# Patient Record
Sex: Female | Born: 1960 | Race: Black or African American | Hispanic: No | Marital: Married | Smoking: Never smoker
Health system: Southern US, Community
[De-identification: ages and names within clinical notes are randomized; demographics above are authoritative.]

## PROBLEM LIST (undated history)

## (undated) DIAGNOSIS — E010 Iodine-deficiency related diffuse (endemic) goiter: Secondary | ICD-10-CM

## (undated) DIAGNOSIS — Z8679 Personal history of other diseases of the circulatory system: Secondary | ICD-10-CM

## (undated) DIAGNOSIS — M545 Low back pain: Secondary | ICD-10-CM

## (undated) DIAGNOSIS — Z87448 Personal history of other diseases of urinary system: Secondary | ICD-10-CM

## (undated) DIAGNOSIS — I499 Cardiac arrhythmia, unspecified: Secondary | ICD-10-CM

## (undated) DIAGNOSIS — Z889 Allergy status to unspecified drugs, medicaments and biological substances status: Secondary | ICD-10-CM

## (undated) DIAGNOSIS — D219 Benign neoplasm of connective and other soft tissue, unspecified: Secondary | ICD-10-CM

## (undated) DIAGNOSIS — N921 Excessive and frequent menstruation with irregular cycle: Secondary | ICD-10-CM

## (undated) DIAGNOSIS — Z86718 Personal history of other venous thrombosis and embolism: Secondary | ICD-10-CM

## (undated) DIAGNOSIS — J302 Other seasonal allergic rhinitis: Secondary | ICD-10-CM

## (undated) DIAGNOSIS — Z8739 Personal history of other diseases of the musculoskeletal system and connective tissue: Secondary | ICD-10-CM

## (undated) DIAGNOSIS — Z8619 Personal history of other infectious and parasitic diseases: Secondary | ICD-10-CM

## (undated) DIAGNOSIS — K219 Gastro-esophageal reflux disease without esophagitis: Secondary | ICD-10-CM

## (undated) HISTORY — PX: THYROID SURGERY: SHX805

## (undated) HISTORY — DX: Personal history of other diseases of urinary system: Z87.448

## (undated) HISTORY — DX: Personal history of other infectious and parasitic diseases: Z86.19

## (undated) HISTORY — PX: OTHER SURGICAL HISTORY: SHX169

## (undated) HISTORY — DX: Excessive and frequent menstruation with irregular cycle: N92.1

## (undated) HISTORY — DX: Personal history of other venous thrombosis and embolism: Z86.718

## (undated) HISTORY — DX: Allergy status to unspecified drugs, medicaments and biological substances: Z88.9

## (undated) HISTORY — PX: THROAT SURGERY: SHX803

## (undated) HISTORY — DX: Personal history of other diseases of the circulatory system: Z86.79

## (undated) HISTORY — PX: ABDOMINAL HYSTERECTOMY: SHX81

## (undated) HISTORY — DX: Low back pain: M54.5

## (undated) HISTORY — DX: Benign neoplasm of connective and other soft tissue, unspecified: D21.9

## (undated) HISTORY — DX: Iodine-deficiency related diffuse (endemic) goiter: E01.0

## (undated) HISTORY — PX: WISDOM TOOTH EXTRACTION: SHX21

## (undated) HISTORY — DX: Personal history of other diseases of the musculoskeletal system and connective tissue: Z87.39

---

## 1980-05-01 HISTORY — PX: BREAST SURGERY: SHX581

## 1997-05-11 ENCOUNTER — Ambulatory Visit (HOSPITAL_COMMUNITY): Admission: RE | Admit: 1997-05-11 | Discharge: 1997-05-11 | Payer: Self-pay | Admitting: *Deleted

## 1998-03-03 HISTORY — PX: TUBAL LIGATION: SHX77

## 1998-08-16 ENCOUNTER — Other Ambulatory Visit: Admission: RE | Admit: 1998-08-16 | Discharge: 1998-08-16 | Payer: Self-pay | Admitting: *Deleted

## 1998-10-26 ENCOUNTER — Ambulatory Visit (HOSPITAL_COMMUNITY): Admission: RE | Admit: 1998-10-26 | Discharge: 1998-10-26 | Payer: Self-pay | Admitting: *Deleted

## 1999-03-04 HISTORY — PX: OTHER SURGICAL HISTORY: SHX169

## 1999-10-30 ENCOUNTER — Other Ambulatory Visit: Admission: RE | Admit: 1999-10-30 | Discharge: 1999-10-30 | Payer: Self-pay | Admitting: Radiology

## 2001-03-03 HISTORY — PX: CHOLECYSTECTOMY: SHX55

## 2001-04-06 ENCOUNTER — Encounter: Payer: Self-pay | Admitting: *Deleted

## 2001-04-06 ENCOUNTER — Encounter: Admission: RE | Admit: 2001-04-06 | Discharge: 2001-04-06 | Payer: Self-pay | Admitting: *Deleted

## 2001-04-12 ENCOUNTER — Emergency Department (HOSPITAL_COMMUNITY): Admission: EM | Admit: 2001-04-12 | Discharge: 2001-04-12 | Payer: Self-pay | Admitting: Emergency Medicine

## 2001-04-12 ENCOUNTER — Encounter: Payer: Self-pay | Admitting: Emergency Medicine

## 2001-04-16 ENCOUNTER — Encounter: Payer: Self-pay | Admitting: General Surgery

## 2001-04-16 ENCOUNTER — Observation Stay (HOSPITAL_COMMUNITY): Admission: RE | Admit: 2001-04-16 | Discharge: 2001-04-17 | Payer: Self-pay | Admitting: General Surgery

## 2001-04-16 ENCOUNTER — Encounter (INDEPENDENT_AMBULATORY_CARE_PROVIDER_SITE_OTHER): Payer: Self-pay | Admitting: Specialist

## 2001-05-27 ENCOUNTER — Ambulatory Visit (HOSPITAL_COMMUNITY): Admission: RE | Admit: 2001-05-27 | Discharge: 2001-05-27 | Payer: Self-pay | Admitting: Gastroenterology

## 2001-11-16 DIAGNOSIS — M545 Low back pain, unspecified: Secondary | ICD-10-CM

## 2001-11-16 DIAGNOSIS — Z87448 Personal history of other diseases of urinary system: Secondary | ICD-10-CM

## 2001-11-16 HISTORY — DX: Personal history of other diseases of urinary system: Z87.448

## 2001-11-16 HISTORY — DX: Low back pain, unspecified: M54.50

## 2002-11-15 ENCOUNTER — Other Ambulatory Visit: Admission: RE | Admit: 2002-11-15 | Discharge: 2002-11-15 | Payer: Self-pay | Admitting: Obstetrics and Gynecology

## 2003-09-05 ENCOUNTER — Encounter: Admission: RE | Admit: 2003-09-05 | Discharge: 2003-09-05 | Payer: Self-pay | Admitting: Internal Medicine

## 2003-11-16 ENCOUNTER — Other Ambulatory Visit: Admission: RE | Admit: 2003-11-16 | Discharge: 2003-11-16 | Payer: Self-pay | Admitting: Obstetrics and Gynecology

## 2004-11-19 ENCOUNTER — Other Ambulatory Visit: Admission: RE | Admit: 2004-11-19 | Discharge: 2004-11-19 | Payer: Self-pay | Admitting: Obstetrics and Gynecology

## 2005-11-21 ENCOUNTER — Other Ambulatory Visit: Admission: RE | Admit: 2005-11-21 | Discharge: 2005-11-21 | Payer: Self-pay | Admitting: Obstetrics and Gynecology

## 2005-12-04 ENCOUNTER — Ambulatory Visit (HOSPITAL_COMMUNITY): Admission: RE | Admit: 2005-12-04 | Discharge: 2005-12-04 | Payer: Self-pay | Admitting: Obstetrics and Gynecology

## 2006-11-24 DIAGNOSIS — E010 Iodine-deficiency related diffuse (endemic) goiter: Secondary | ICD-10-CM

## 2006-11-24 HISTORY — DX: Iodine-deficiency related diffuse (endemic) goiter: E01.0

## 2006-11-25 ENCOUNTER — Ambulatory Visit (HOSPITAL_COMMUNITY): Admission: RE | Admit: 2006-11-25 | Discharge: 2006-11-25 | Payer: Self-pay | Admitting: Obstetrics and Gynecology

## 2009-09-12 ENCOUNTER — Emergency Department (HOSPITAL_COMMUNITY): Admission: EM | Admit: 2009-09-12 | Discharge: 2009-09-13 | Payer: Self-pay | Admitting: Emergency Medicine

## 2009-10-01 ENCOUNTER — Encounter: Admission: RE | Admit: 2009-10-01 | Discharge: 2009-10-01 | Payer: Self-pay | Admitting: Obstetrics and Gynecology

## 2009-10-26 ENCOUNTER — Ambulatory Visit (HOSPITAL_COMMUNITY): Admission: RE | Admit: 2009-10-26 | Discharge: 2009-10-26 | Payer: Self-pay | Admitting: Obstetrics and Gynecology

## 2009-10-26 DIAGNOSIS — N921 Excessive and frequent menstruation with irregular cycle: Secondary | ICD-10-CM

## 2009-10-26 HISTORY — DX: Excessive and frequent menstruation with irregular cycle: N92.1

## 2009-11-14 ENCOUNTER — Encounter (INDEPENDENT_AMBULATORY_CARE_PROVIDER_SITE_OTHER): Payer: Self-pay | Admitting: Otolaryngology

## 2009-11-14 ENCOUNTER — Ambulatory Visit (HOSPITAL_COMMUNITY): Admission: RE | Admit: 2009-11-14 | Discharge: 2009-11-15 | Payer: Self-pay | Admitting: Otolaryngology

## 2010-05-16 LAB — CBC
HCT: 41.5 % (ref 36.0–46.0)
Hemoglobin: 14 g/dL (ref 12.0–15.0)
MCH: 30.1 pg (ref 26.0–34.0)
MCV: 89.2 fL (ref 78.0–100.0)
RBC: 4.65 MIL/uL (ref 3.87–5.11)
WBC: 9.6 10*3/uL (ref 4.0–10.5)

## 2010-05-16 LAB — SURGICAL PCR SCREEN: MRSA, PCR: NEGATIVE

## 2010-05-17 LAB — CBC
HCT: 38.7 % (ref 36.0–46.0)
Hemoglobin: 13 g/dL (ref 12.0–15.0)
MCHC: 33.5 g/dL (ref 30.0–36.0)
RBC: 4.26 MIL/uL (ref 3.87–5.11)
RDW: 12.7 % (ref 11.5–15.5)

## 2010-05-19 LAB — CBC
HCT: 37 % (ref 36.0–46.0)
Hemoglobin: 12.6 g/dL (ref 12.0–15.0)
MCH: 30.6 pg (ref 26.0–34.0)
MCV: 89.8 fL (ref 78.0–100.0)
Platelets: 256 10*3/uL (ref 150–400)
RBC: 4.12 MIL/uL (ref 3.87–5.11)
WBC: 10.2 10*3/uL (ref 4.0–10.5)

## 2010-07-19 NOTE — Procedures (Signed)
[place]  Patient:    [name], [name] Visit Number: [id number] MRN: [id number]          Service Type: END Location: ENDO Attending Physician:  Dennison Bulla Ii Dictated by:   Verlin Grills, M.D. Proc. Date: 05/27/01 Admit Date:  05/27/2001 Discharge Date: 05/27/2001   CC:         Darius Bump, M.D.   Procedure Report  REFERRING PHYSICIAN:  Darius Bump, M.D.  INDICATIONS FOR PROCEDURE:  Ms. Allison Maldonado. Paulson is a 50 year old female born 1960-09-05.  Allison Maldonado had an episode of painless hematochezia which has resolved.  I discussed with Allison Maldonado the complications associated with colonoscopy and polypectomy, including a 15:1000 risk of bleeding and 4:1000 risk of colon perforation requiring surgical repair.  Allison Maldonado has signed the operative permit.  ENDOSCOPIST:  Verlin Grills, M.D.  PREMEDICATION: 1. Versed 7.5 mg. 2. Demerol 50 mg.  ENDOSCOPE:  Olympus pediatric colonoscope.  DESCRIPTION OF PROCEDURE:  After obtaining informed consent, Allison Maldonado was placed in the left lateral decubitus position.  I administered intravenous Demerol and intravenous Versed to achieve conscious sedation for the procedure.  The patients blood pressure, oxygen saturation, and cardiac rhythm were monitored throughout the procedure and documented in the medical record.  Anal inspection normal.  Digital rectal examination normal.  The Olympus pediatric video colonoscope was introduced into the rectum and easily advanced to the cecum.  Colonic preparation for the examination today was excellent.  Rectum normal.  Sigmoid colon and descending colon normal.  Splenic flexure normal.  Transverse colon normal.  Hepatic flexure normal.  Ascending colon normal.  Cecum and ileocecal valve normal.  ASSESSMENT:  Normal proctocolonoscopy to the cecum.  No endoscopic evidence for the presence of colorectal  neoplasia or inflammatory bowel disease. Dictated by:   Verlin Grills, M.D. Attending Physician:  Dennison Bulla Ii DD:  05/27/01 TD:  05/28/01 Job: [id number]

## 2010-07-19 NOTE — Op Note (Signed)
[facility]  Patient:    Allison Maldonado, Allison Maldonado Visit Number: [id number] MRN: [id number]          Service Type: SUR Location: 4W [id number] [id number] Attending Physician:  Henrene Dodge Dictated by:   Anselm Pancoast. Zachery Dakins, M.D. Proc. Date: 04/16/01 Admit Date:  04/16/2001   CC:         Veatrice Bourbon, M.D.   Operative Report  PREOPERATIVE DIAGNOSIS:  Chronic cholecystitis.  POSTOPERATIVE DIAGNOSIS:  Chronic cholecystitis.  OPERATION PERFORMED:  Laparoscopic cholecystectomy with cholangiogram.  ANESTHESIA:  General.  SURGEON:  Anselm Pancoast. Zachery Dakins, M.D.  ASSISTANT:  Rose Phi. Maple Hudson, M.D.  HISTORY OF PRESENT ILLNESS:  Allison Maldonado is a 50 year old female who was seen in the emergency room last Sunday night late, early Monday morning with right flank and upper abdominal pain. She had seen her primary physician approximately two days earlier with kind of flank type symptoms, was completing her period and they thought possibly she had a urinary tract infection when her urinalysis showed a few white cells and a small amount of blood and they started her on sulfa. She did not improve and on Sunday night with more severe intense pain still described as more of a flank instead of right upper quadrant, she presented to the emergency room, had completed her period and was examined by the ER physicians and thought possibly to be having a kidney stone. She had a CT without contrast which showed no evidence of any obstruction of the right ureter, no evidence of mass or anything in the kidney. She was noted to have a large gallstone and they were unsure whether this was acute cholecystitis or just what. She was released after her lab studies and liver function studies and a repeat urinalysis showed a large amount of blood at this time. She had completed about three or four days of Bactrim that she had originally been on and then instructed to see [place] because of the  gallstone. I saw her in my office two days later at which time she was not acutely ill. Her symptoms appeared to be more right flank, right lower quadrant and whether this was kidney or a gallbladder in origin, I was not sure. I did repeat a clean catch urine for culture and urinalysis and there was only trace blood at this time and a culture showed no evidence of any bacteria. She continued to have kind of mild right upper quadrant pain and I recommended we proceed on with a laparoscopic cholecystectomy and cholangiogram. On exam today, she is certainly in less pain than she was. Preoperatively she has PAS stockings and was given 3 gm of Unasyn and taken to the operative suite.  DESCRIPTION OF PROCEDURE:  Induction of general anesthesia, endotracheal tube and oral tube into the stomach and then the abdomen was prepped with Betadine surgical scrub and solution and draped in a sterile manner. A small incision was made at the umbilicus, sharp dissection down, the fascia identified, this picked up between two hemostats, a small opening through the peritoneal cavity. The Hasson cannula was introduced, pursestring suture had been placed and then the gallbladder was visualized and it was distended but not acutely inflamed. There was no evidence of any inflammation in the right upper quadrant or in the right lower quadrant. The two lateral 5 mm trocars and the upper 10 mm trocar were placed in the ______ after anesthetizing the fascia and then the gallbladder was grasped upward and outward. The cystic  duct and cystic artery were identified, clipped flush with the gallbladder on the cystic duct and a small opening made, a cook catheter introduced and held in place with a clip. The gallbladder cholangiogram was obtained, there was good prompt filling of the extrahepatic biliary systems and intrahepatics had good flow into the duodenum. We removed the catheter and then triple clipped the cystic  duct, divided it, and then divided the cystic artery after it had been doubly clipped proximally, singly distally. The gallbladder was then freed from its bed and good hemostasis obtained. I brought the gallbladder at the umbilical fascia. It was necessary to open the fascia a little bit to get this large stone out and then we closed the pursestring and put a second stitch of figure-of-eight of #0 Vicryl to closed the little fascial defect. On reinspection, no evidence of any bleeding, the fascia at the umbilicus was anesthetized with Marcaine with adrenaline and then the irrigating fluid had been removed. The 5 mm trocars were removed under direct vision and then the carbon dioxide released and the upper 10 mm trocar withdrawn. The patient tolerated the procedure nicely and hopefully will be ready for discharge in the a.m. Dictated by:   Anselm Pancoast. Zachery Dakins, M.D. Attending Physician:  Henrene Dodge DD:  04/16/01 TD:  04/16/01 Job: [id number]

## 2011-06-26 ENCOUNTER — Encounter (HOSPITAL_COMMUNITY): Payer: Self-pay

## 2011-06-26 ENCOUNTER — Inpatient Hospital Stay (HOSPITAL_COMMUNITY): Payer: BC Managed Care – PPO

## 2011-06-26 ENCOUNTER — Telehealth: Payer: Self-pay | Admitting: Obstetrics and Gynecology

## 2011-06-26 ENCOUNTER — Inpatient Hospital Stay (HOSPITAL_COMMUNITY)
Admission: AD | Admit: 2011-06-26 | Discharge: 2011-06-26 | Disposition: A | Discharge status: Home / Self Care | Payer: BC Managed Care – PPO | Source: Ambulatory Visit | Attending: Obstetrics and Gynecology | Admitting: Obstetrics and Gynecology

## 2011-06-26 ENCOUNTER — Other Ambulatory Visit: Payer: Self-pay

## 2011-06-26 DIAGNOSIS — D259 Leiomyoma of uterus, unspecified: Secondary | ICD-10-CM | POA: Insufficient documentation

## 2011-06-26 DIAGNOSIS — N949 Unspecified condition associated with female genital organs and menstrual cycle: Secondary | ICD-10-CM

## 2011-06-26 DIAGNOSIS — N938 Other specified abnormal uterine and vaginal bleeding: Secondary | ICD-10-CM

## 2011-06-26 DIAGNOSIS — N920 Excessive and frequent menstruation with regular cycle: Secondary | ICD-10-CM | POA: Insufficient documentation

## 2011-06-26 DIAGNOSIS — R102 Pelvic and perineal pain: Secondary | ICD-10-CM

## 2011-06-26 LAB — CBC
Hemoglobin: 12.6 g/dL (ref 12.0–15.0)
MCH: 29.8 pg (ref 26.0–34.0)
MCV: 90.1 fL (ref 78.0–100.0)
WBC: 9.2 10*3/uL (ref 4.0–10.5)

## 2011-06-26 LAB — POCT PREGNANCY, URINE: Preg Test, Ur: NEGATIVE

## 2011-06-26 MED ORDER — MEDROXYPROGESTERONE ACETATE 10 MG PO TABS: 10.0000 mg | ORAL_TABLET | Freq: Two times a day (BID) | ORAL | Status: DC

## 2011-06-26 NOTE — MAU Provider Note (Signed)
History   Allison Maldonado is a 50y.o. Married black female who presents for eval of heavy vaginal bleeding.  She reports normal menses (usu 4-5 days of flow) last week, and it ended Sun 4/21.  On Tues of this week, starting bleeding again, light flow, but became much heavier yesterday, and bled through her clothes at her daughter's track meet yesterday evening.  She says the bleeding is worse when she stands for long periods, or when she sits on commode, and these positions cause "surges" of blood to come out.  She reports bleeding ok when she is at rest or lying down.  She has had some clots.  Mild cramping today, but was more intense yesterday and took Aleve and improved; declines need for any pain medicine today.  She reports having a Novasure ablation and D&C 10/26/2009 with Dr. Normand Sloop for menometorrhagia and fibroids, although hysteroscopy at that time showed no fibroids or polyps.  In Sept. 2011, pt had thyroid lobectomy, and has had levels checked w/in last year and normal and does not take any thyroid supplementation.  Previous medical history includes cholecystectomy, BTL, and breast cystectomy.   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  Pt denies any dizziness.  No recent illness or fever.  No resp or GI c/o's.  Normal appetite recently.  Reports regular periods since ablation until this month.  She does not take any meds on a regular basis.  She is accompanied by her husband to MAU.    CSN: [id number]  Arrival date and time: 06/26/11 1144   None     Chief Complaint  Patient presents with  . Menorrhagia   HPI  OB History    Grav Para Term Preterm Abortions TAB SAB Ect Mult Living   2         2      History reviewed. No pertinent past medical history.  Past Surgical History  Procedure Date  . Bilateral tubal ligation     Family History  Problem Relation Age of Onset  . Depression  Sister   . Cancer Brother   . Cancer Maternal Aunt     History  Substance Use Topics  . Smoking status: Never Smoker   . Smokeless tobacco: Not on file  . Alcohol Use: Yes     occasional    Allergies:  Allergies  Allergen Reactions  . Shellfish Allergy Hives    No prescriptions prior to admission    ROS--see history above Physical Exam  .. Results for orders placed during the hospital encounter of 06/26/11 (from the past 24 hour(s))  CBC     Status: Normal   Collection Time   06/26/11 12:20 PM      Component Value Range   WBC 9.2  4.0 - 10.5 (K/uL)   RBC 4.23  3.87 - 5.11 (MIL/uL)   Hemoglobin 12.6  12.0 - 15.0 (g/dL)   HCT 11.9  14.7 - 82.9 (%)   MCV 90.1  78.0 - 100.0 (fL)   MCH 29.8  26.0 - 34.0 (pg)   MCHC 33.1  30.0 - 36.0 (g/dL)   RDW 56.2  13.0 - 86.5 (%)   Platelets 253  150 - 400 (K/uL)  POCT PREGNANCY, URINE     Status: Normal   Collection Time   06/26/11  2:52 PM      Component Value Range   Preg  Test, Ur NEGATIVE  NEGATIVE    Blood pressure 130/71, pulse 88, temperature 97.9 F (36.6 C), temperature source Oral, resp. rate 18, height 5\' 6"  (1.676 m), weight 92.987 kg (205 lb), last menstrual period 06/23/2011, SpO2 99.00%. .. Filed Vitals:   06/26/11 1159 06/26/11 1202 06/26/11 1501  BP: 130/71  126/81  Pulse: 88  81  Temp:  97.9 F (36.6 C)   TempSrc:  Oral   Resp: 18  18  Height:  5\' 6"  (1.676 m)   Weight:  92.987 kg (205 lb)   SpO2:  99%    *RADIOLOGY REPORT*   Clinical Data: Dysfunctional uterine bleeding.  Pelvic pain and heavy menses.  Post ablation 2 years ago.  LMP 06/16/2011.   TRANSABDOMINAL AND TRANSVAGINAL ULTRASOUND OF PELVIS Technique:  Both transabdominal and transvaginal ultrasound examinations of the pelvis were performed. Transabdominal technique was performed for global imaging of the pelvis including uterus, ovaries, adnexal regions, and pelvic cul-de-sac.   Comparison: None.    It was necessary to proceed with  endovaginal exam following the transabdominal exam to visualize the myometrium, endometrium and adnexa.   Findings:   Uterus: Demonstrates a sagittal length of 10.5 cm, depth of 7.5 cm and width of 8.9 cm.  Several focal fibroids are identified, all mural and located in the right lateral lower uterine segment measuring 1.8 x 1.6 x 1.7 cm, in the right lateral upper uterine segment measuring 1.7 x 1.8 x 2.4 cm, in the fundal region measuring 1.7 x 1.3 x 2.0 cm and in the anterior fundus measuring 1.3 x 1.0 x 2.3 cm.   Endometrium: Heterogeneous mobile complex fluid is identified within the endometrial canal compatible with intraluminal blood. This would correlate with the patient's history of active bleeding. The presence of the blood within the endometrial canal compromises evaluation but no obvious focal lesions are identified.   Right ovary:  Has a normal appearance measuring 2.8 x 1.6 x 1.5 cm   Left ovary: Is well seen only transabdominally and has a normal transabdominal appearance measuring 2.7 by 1.0 by 2.6 cm   Other findings: No pelvic fluid or separate adnexal masses are seen.   IMPRESSION: Heterogeneous mobile material identified within the endometrial canal compatible with intraluminal blood and correlating with the history of active bleeding.  No definite focal endometrial abnormality is seen but assessment is compromised by the presence of the intraluminal blood.  Reassessment once the patient has stopped actively bleeding would be helpful.   Mural fibroids with sizes and locations as noted above. Normal ovaries.   Original Report Authenticated By: Bertha Stakes, M.D.  Physical Exam  Constitutional: She is oriented to person, place, and time. She appears well-developed and well-nourished. No distress.       Very pleasant  HENT:  Head: Normocephalic and atraumatic.  Eyes:       glasses  Neck:       Scar on neck from thyroid goiter removal    Cardiovascular: Normal rate and regular rhythm.   Respiratory: Effort normal and breath sounds normal.  GI: Soft. She exhibits no mass. There is no tenderness. There is no rebound and no guarding.  Genitourinary:       SSE:  Mod amt of BRB in vault; no active bleeding; Probably cervical fibroid at 10 o'clock.  sm amt of blood on vulva and perienum.  No clots.  Ut more easily palpated on Pt's Lt side; no CMT.  Difficulty assessing adnexa and ovaries secondary to habitus.  Neurological: She is alert and oriented to person, place, and time.  Skin: Skin is warm and dry.  Psychiatric: She has a normal mood and affect. Her behavior is normal. Judgment and thought content normal.    MAU Course  Procedures 1.  Cbc 2.  SSE 3.  Pelvic u/s 4.  Thyroid panel w/ TSH 5.  UPT Assessment and Plan  1.  50y.o. Nonpregnant MBF w/ DUB s/p ablation 10/26/09 2.  Menorrhagia  3.  nml CBC 4.  H/o thyroid lobectomy 5.  U/s w/ poor visualization of endometrium secondary to intraluminal blood; several small fibroids  1.  Per c/w Dr. Su Hilt, d/c pt home w/ bleeding precautions to start Provera 10mg  po bid until bleeding has decreased to spotting, then just take po qd until f/u w/ Dr. Normand Sloop on 07/01/11 at 0830 for f/u u/s and visit. 2.  disc'd adequate hydration, slow position changes, and increase iron in diet. 3.  Thyroid panel still pending at time of d/c Knolan Simien H 06/26/2011, 2:56 PM

## 2011-06-26 NOTE — Telephone Encounter (Signed)
Routed to triage 

## 2011-06-26 NOTE — Discharge Instructions (Signed)
Menorrhagia  Menorrhagia is when a menstrual period is heavier or longer than normal. HOME CARE  Only take medicine as told by your doctor.   Do not take aspirin 1 week before or during your period. Aspirin can make the bleeding worse.   Lay down for a while if you change your tampon or pad more than once in 2 hours. This may help lessen the bleeding.   Take any iron pills as told by your doctor. Heavy bleeding may cause you to lack iron in your body.   Eat a healthy diet and foods with iron. These foods include leafy green vegetables, meat, liver, eggs, and whole grain breads and cereals.   Eat foods that are high in vitamin C. These include oranges, orange juice, and grapefruits. Vitamin C can help your body take in more iron.   Do not try to lose weight. Wait until the heavy bleeding has stopped and your iron level is normal.  GET HELP RIGHT AWAY IF:  You get a fever.   You have trouble breathing.   You bleed even more heavily than usual and pass blood clots.   You feel dizzy, weak, or pass out (faint).   You need to change your tampon or pad more than once an hour.   You feel sick to your stomach (nauseous), throw up (vomit), or have watery poop (diarrhea).   You have problems from medicine.  MAKE SURE YOU:   Understand these instructions.   Will watch your condition.   Will get help right away if you are not doing well or get worse.  Document Released: 11/27/2007 Document Revised: 02/06/2011 Document Reviewed: 11/27/2007 [facility] Patient Information 2012 [place], [place]. Abnormal Uterine Bleeding Abnormal uterine bleeding can have many causes. Some cases are simply treated, while others are more serious. There are several kinds of bleeding that is considered abnormal, including:  Bleeding between periods.   Bleeding after sexual intercourse.   Spotting anytime in the menstrual cycle.   Bleeding heavier or more than normal.   Bleeding after menopause.  CAUSES    There are many causes of abnormal uterine bleeding. It can be present in teenagers, pregnant women, women during their reproductive years, and women who have reached menopause. Your caregiver will look for the more common causes depending on your age, signs, symptoms and your particular circumstance. Most cases are not serious and can be treated. Even the more serious causes, like cancer of the female organs, can be treated adequately if found in the early stages. That is why all types of bleeding should be evaluated and treated as soon as possible. DIAGNOSIS  Diagnosing the cause may take several kinds of tests. Your caregiver may:  Take a complete history of the type of bleeding.   Perform a complete physical exam and Pap smear.   Take an ultrasound on the abdomen showing a picture of the female organs and the pelvis.   Inject dye into the uterus and Fallopian tubes and X-ray them (hysterosalpingogram).   Place fluid in the uterus and do an ultrasound (sonohysterogrqphy).   Take a CT scan to examine the female organs and pelvis.   Take an MRI to examine the female organs and pelvis. There is no X-ray involved with this procedure.   Look inside the uterus with a telescope that has a light at the end (hysteroscopy).   Scrap the inside of the uterus to get tissue to examine (Dilatation and Curettage, D&C).   Look into the  pelvis with a telescope that has a light at the end (laparoscopy). This is done through a very small cut (incision) in the abdomen.  TREATMENT  Treatment will depend on the cause of the abnormal bleeding. It can include:  Doing nothing to allow the problem to take care of itself over time.   Hormone treatment.   Birth control pills.   Treating the medical condition causing the problem.   Laparoscopy.   Major or minor surgery   Destroying the lining of the uterus with electrical currant, laser, freezing or heat (uterine ablation).  HOME CARE INSTRUCTIONS    Follow your caregiver's recommendation on how to treat your problem.   See your caregiver if you missed a menstrual period and think you may be pregnant.   If you are bleeding heavily, count the number of pads/tampons you use and how often you have to change them. Tell this to your caregiver.   Avoid sexual intercourse until the problem is controlled.  SEEK MEDICAL CARE IF:   You have any kind of abnormal bleeding mentioned above.   You feel dizzy at times.   You are 51 years old and have not had a menstrual period yet.  SEEK IMMEDIATE MEDICAL CARE IF:   You pass out.   You are changing pads/tampons every 15 to 30 minutes.   You have belly (abdominal) pain.   You have a temperature of 100 F (37.8 C) or higher.   You become sweaty or weak.   You are passing large blood clots from the vagina.   You start to feel sick to your stomach (nauseous) and throw up (vomit).  Document Released: 02/17/2005 Document Revised: 02/06/2011 Document Reviewed: 07/13/2008 [facility] Patient Information 2012 [place], [place].

## 2011-06-26 NOTE — Telephone Encounter (Signed)
Returned pt's call,pt wanted an appt today for heavy bleeding with clots,soaking a pad every hr. Pt has hx of polyps and says these are the same symptoms as before. There was no openings here in the office today however Sherron Monday was notified (on call) she stated that she would call the pt with further instructions.PG CMA

## 2011-06-26 NOTE — MAU Note (Signed)
Pt states had ablation 2 years ago for heavy bleeding. LMP-06/16/2011, normal cycle, came back on 06/23/2011, light then yesterday became very heavy. Passing thin long clots, has changed pad x2 in past hour. Denies abnormal vaginal discharge. No pain at present.

## 2011-06-27 LAB — T4, FREE: Free T4: 0.9 ng/dL (ref 0.80–1.80)

## 2011-07-01 ENCOUNTER — Ambulatory Visit (INDEPENDENT_AMBULATORY_CARE_PROVIDER_SITE_OTHER): Payer: BC Managed Care – PPO

## 2011-07-01 ENCOUNTER — Encounter: Payer: Self-pay | Admitting: Obstetrics and Gynecology

## 2011-07-01 ENCOUNTER — Ambulatory Visit (INDEPENDENT_AMBULATORY_CARE_PROVIDER_SITE_OTHER): Payer: BC Managed Care – PPO | Admitting: Obstetrics and Gynecology

## 2011-07-01 ENCOUNTER — Other Ambulatory Visit: Payer: Self-pay

## 2011-07-01 VITALS — BP 120/80 | Ht 66.0 in | Wt 202.0 lb

## 2011-07-01 DIAGNOSIS — N949 Unspecified condition associated with female genital organs and menstrual cycle: Secondary | ICD-10-CM

## 2011-07-01 DIAGNOSIS — N920 Excessive and frequent menstruation with regular cycle: Secondary | ICD-10-CM

## 2011-07-01 DIAGNOSIS — R102 Pelvic and perineal pain: Secondary | ICD-10-CM

## 2011-07-01 NOTE — Patient Instructions (Signed)

## 2011-07-01 NOTE — Progress Notes (Addendum)
Contraception: no Fibroids: yes Hormone Therapy: yes New Medications: no Menopausal Symptoms: no Vag. Discharge: no Abdominal Pain: yes Increased Stress: yes  Pt states started bleeding 06/25/11 changing a pad an hr with mild cramping Subjective:     Allison Maldonado is an 51 y.o. woman who presents for irregular menses. She had been bleeding irregularly since 4/25. She is using 10-12 pads per day.  Standing makes it worse.   She is now bleeding every 28 days and menses are lasting 5 days.  Clots are 3cm cm in size.mild, occurring throughout cycle Cyclic symptoms include: bloating, breast tenderness and headache. Denies any urinary tract symptoms, changes in bowel movements, nausea, vomiting or fever. Current contraception: tubal ligation.none History of infertility: no. History of abnormal Pap smear: no.  Menstrual History: OB History    Grav Para Term Preterm Abortions TAB SAB Ect Mult Living   2         2      Menarche age: 29 Patient's last menstrual period was 06/16/2011.      Review of Systems Endocrine: positive for enlarged thyroid  and history of thyroidectomy   Objective:    BP 120/80  Ht 5\' 6"  (1.676 m)  Wt 202 lb (91.627 kg)  BMI 32.60 kg/m2  LMP 06/16/2011  General:   alert  Skin:    normal, no cyanosis, jaundice, pallor or bruising  Neck:  supple and no masses Heart: RRR     Lungs: clear  Abdomen:  soft, non-tender; bowel sounds normal; no masses,  no organomegaly  Pelvic:   cervix normal in appearance, external genitalia normal, no adnexal masses or tenderness, no cervical motion tenderness, positive findings: , rectovaginal septum normal, uterus normal size, shape, and consistency and vagina normal without discharge     Pt had moderate vaginal bleeding     Assessment:    Dysfunctional uterine bleeding  Pt had novasure in 2011 [place] 11.4 x7.64x 6.63cm Three fibroids the largest is 2.6cm Questionable endometrial polyp   Plan:    Endometrial biopsy -  see separate procedure note. SHG/EMBX:  The patient was consented for both procedures.  She was placed in dorsal lithotomy position and speculum placed in the vagina.  The cervix was cleansed with three betadine swabs.  The endometrial pipet was placed in the the endometrial cavity through the cervix.  The uterus did sound to 10cm.  The pipet was removed and specimen was sent to pathology.  All treatments reviewed with pt.  Obs, hormones, lysteda, lupron, D&C, hysterectomy.  Pt chose hysterectomy.  R&B reviewed with the pt.  Will schedule

## 2011-07-03 ENCOUNTER — Telehealth: Payer: Self-pay | Admitting: Obstetrics and Gynecology

## 2011-07-09 ENCOUNTER — Other Ambulatory Visit: Payer: Self-pay | Admitting: Obstetrics and Gynecology

## 2011-07-10 NOTE — Progress Notes (Signed)
Addended by: Jaymes Graff on: 07/10/2011 08:35 AM   Modules accepted: Orders

## 2011-07-21 ENCOUNTER — Telehealth: Payer: Self-pay | Admitting: Obstetrics and Gynecology

## 2011-07-21 NOTE — Telephone Encounter (Signed)
[facility] POSSIBLE BSO POSS TAH; CYSTOSCOPY SCHEDULED FOR 08/11/11 AT 9:30 WITH ND/AR. bcbs EFFECTIVE 03/04/11.  PLAN PAYS 100%; NO COPAYMENT, NO OUT OF POCKET, NO DEDUCTIBLE.  Adrianne Pridgen

## 2011-07-25 ENCOUNTER — Encounter (HOSPITAL_COMMUNITY): Payer: Self-pay

## 2011-07-31 ENCOUNTER — Ambulatory Visit (INDEPENDENT_AMBULATORY_CARE_PROVIDER_SITE_OTHER): Payer: BC Managed Care – PPO | Admitting: Obstetrics and Gynecology

## 2011-07-31 ENCOUNTER — Encounter: Payer: Self-pay | Admitting: Obstetrics and Gynecology

## 2011-07-31 VITALS — BP 128/74 | HR 88 | Temp 98.8°F | Ht 66.0 in | Wt 200.0 lb

## 2011-07-31 DIAGNOSIS — D251 Intramural leiomyoma of uterus: Secondary | ICD-10-CM

## 2011-07-31 DIAGNOSIS — Z0000 Encounter for general adult medical examination without abnormal findings: Secondary | ICD-10-CM

## 2011-07-31 NOTE — Progress Notes (Signed)
Pt is here for pre-op appointment. Hysterectomy is scheduled for 08/11/11. Pt wants to know if there will be any chance of still having fibroids once uterus is removed. Pt states that she wants to keep her ovaries.  Allison Maldonado

## 2011-07-31 NOTE — Patient Instructions (Signed)
Patient Education Materials to be provided at check out (*indicates is located in accordion folder):  Hysterectomy Pt needs bowel prep without abx

## 2011-08-04 ENCOUNTER — Encounter (HOSPITAL_COMMUNITY)
Admission: RE | Admit: 2011-08-04 | Discharge: 2011-08-04 | Disposition: A | Discharge status: Home / Self Care | Payer: BC Managed Care – PPO | Source: Ambulatory Visit | Attending: Obstetrics and Gynecology | Admitting: Obstetrics and Gynecology

## 2011-08-04 ENCOUNTER — Encounter (HOSPITAL_COMMUNITY): Payer: Self-pay

## 2011-08-04 LAB — CBC
MCH: 29.5 pg (ref 26.0–34.0)
MCHC: 33 g/dL (ref 30.0–36.0)
MCV: 89.5 fL (ref 78.0–100.0)
Platelets: 225 10*3/uL (ref 150–400)
RDW: 13.1 % (ref 11.5–15.5)

## 2011-08-04 LAB — BASIC METABOLIC PANEL
BUN: 11 mg/dL (ref 6–23)
CO2: 28 mEq/L (ref 19–32)
Calcium: 9.3 mg/dL (ref 8.4–10.5)
Creatinine, Ser: 0.68 mg/dL (ref 0.50–1.10)
Glucose, Bld: 133 mg/dL — ABNORMAL HIGH (ref 70–99)

## 2011-08-04 NOTE — Patient Instructions (Addendum)
20 Allison Maldonado  08/04/2011   Your procedure is scheduled on:  08/11/11  Enter through the Main Entrance of [facility] at 800 AM.  Pick up the phone at the desk and dial [phone].   Call this number if you have problems the morning of surgery: [phone]   Remember:   Do not eat food:After Midnight.  Do not drink clear liquids: After Midnight.  Take these medicines the morning of surgery with A SIP OF WATER: NA   Do not wear jewelry, make-up or nail polish.  Do not wear lotions, powders, or perfumes. You may wear deodorant.  Do not shave 48 hours prior to surgery.  Do not bring valuables to the hospital.  Contacts, dentures or bridgework may not be worn into surgery.  Leave suitcase in the car. After surgery it may be brought to your room.  For patients admitted to the hospital, checkout time is 11:00 AM the day of discharge.   Patients discharged the day of surgery will not be allowed to drive home.  Name and phone number of your driver: NA  Special Instructions: CHG Shower Use Special Wash: 1/2 bottle night before surgery and 1/2 bottle morning of surgery.   Please read over the following fact sheets that you were given: MRSA Information

## 2011-08-10 MED ORDER — CEFAZOLIN SODIUM-DEXTROSE 2-3 GM-% IV SOLR
2.0000 g | INTRAVENOUS | Status: AC
  Administered 2011-08-11: 2 g via INTRAVENOUS
  Filled 2011-08-10: qty 50

## 2011-08-10 NOTE — H&P (Signed)
Allison Maldonado is an 51 y.o. woman who presents for irregular menses. She had been bleeding irregularly since 4/25. She is using 10-12 pads per day. Standing makes it worse. She is now bleeding every 28 days and menses are lasting 5 days. Clots are 3cm cm in size.mild, occurring throughout cycle Cyclic symptoms include: bloating, breast tenderness and headache. Denies any urinary tract symptoms, changes in bowel movements, nausea, vomiting or fever. Current contraception: tubal ligation.none History of infertility: no. History of abnormal Pap smear: no.   PMHX Thyroid nodules  Past Surgical History  Procedure Date  . Bilateral tubal ligation   . Cholecystectomy   . Throat surgery   . Breast surgery   . Uterine ablation 2001   No current facility-administered medications on file prior to encounter.   Current Outpatient Prescriptions on File Prior to Encounter  Medication Sig Dispense Refill  . diphenhydrAMINE (BENADRYL) 25 MG tablet Take 50 mg by mouth daily as needed. Allergies      . naproxen sodium (ANAPROX) 220 MG tablet Take 440-660 mg by mouth daily as needed. Takes 2-3 tablets if needed for cramps       Family History  Problem Relation Age of Onset  . Depression Sister   . Cancer Brother   . Cancer Maternal Aunt    History   Social History  . Marital Status: Married    Spouse Name: N/A    Number of Children: N/A  . Years of Education: N/A   Occupational History  . Not on file.   Social History Main Topics  . Smoking status: Never Smoker   . Smokeless tobacco: Not on file  . Alcohol Use: Yes     occasional  . Drug Use: No  . Sexually Active: Yes    Birth Control/ Protection: Other-see comments     BTL   Other Topics Concern  . Not on file   Social History Narrative  . No narrative on file  ROS Pt with h/o of thyroid disease and above AVSS BP 120/80  Ht 5\' 6"  (1.676 m)  Wt 202 lb (91.627 kg)  BMI 32.60 kg/m2  LMP 06/16/2011  General:  alert   Skin:   normal, no cyanosis, jaundice, pallor or bruising   Neck:  supple and no masses Heart: RRR Lungs: clear   Abdomen:  soft, non-tender; bowel sounds normal; no masses, no organomegaly   Pelvic:  cervix normal in appearance, external genitalia normal, no adnexal masses or tenderness, no cervical motion tenderness, positive findings: , rectovaginal septum normal, uterus normal size, shape, and consistency and vagina normal without discharge   Pt had moderate vaginal bleeding  Assessment:   Dysfunctional uterine bleeding  Pt had novasure in 2011  [place] 11.4 x7.64x 6.63cm  Three fibroids the largest is 2.6cm  Questionable endometrial polyp  Plan:    BP 120/80  Ht 5\' 6"  (1.676 m)  Wt 202 lb (91.627 kg)  BMI 32.60 kg/m2  LMP 06/16/2011  General:  alert   Skin:  normal, no cyanosis, jaundice, pallor or bruising   Neck:  supple and no masses Heart: RRR Lungs: clear   Abdomen:  soft, non-tender; bowel sounds normal; no masses, no organomegaly   Pelvic:  cervix normal in appearance, external genitalia normal, no adnexal masses or tenderness, no cervical motion tenderness, positive findings: , rectovaginal septum normal, uterus normal size, shape, and consistency and vagina normal without discharge   Pt had moderate vaginal bleeding  Assessment:   Dysfunctional uterine  bleeding  Pt had novasure in 2011  [place] 11.4 x7.64x 6.63cm  Three fibroids the largest is 2.6cm  Questionable endometrial polyp  Plan:   Endometrial bx is benign Pt given all options.  She has had a novasure in the past.  Pt understands the risks to be bleeding , infection and damage to internal organs.  Pt signed hysterectomy consent PLan TLH with cystoscopy possible TAH

## 2011-08-11 ENCOUNTER — Ambulatory Visit (HOSPITAL_COMMUNITY)
Admission: RE | Admit: 2011-08-11 | Discharge: 2011-08-12 | Disposition: A | Discharge status: Home / Self Care | Payer: BC Managed Care – PPO | Source: Ambulatory Visit | Attending: Obstetrics and Gynecology | Admitting: Obstetrics and Gynecology

## 2011-08-11 ENCOUNTER — Encounter (HOSPITAL_COMMUNITY)
Admission: RE | Disposition: A | Discharge status: Home / Self Care | Payer: Self-pay | Source: Ambulatory Visit | Attending: Obstetrics and Gynecology

## 2011-08-11 ENCOUNTER — Ambulatory Visit (HOSPITAL_COMMUNITY): Payer: BC Managed Care – PPO | Admitting: Anesthesiology

## 2011-08-11 ENCOUNTER — Encounter (HOSPITAL_COMMUNITY): Payer: Self-pay | Admitting: *Deleted

## 2011-08-11 ENCOUNTER — Encounter (HOSPITAL_COMMUNITY): Payer: Self-pay | Admitting: Anesthesiology

## 2011-08-11 DIAGNOSIS — N949 Unspecified condition associated with female genital organs and menstrual cycle: Secondary | ICD-10-CM | POA: Insufficient documentation

## 2011-08-11 DIAGNOSIS — N938 Other specified abnormal uterine and vaginal bleeding: Secondary | ICD-10-CM | POA: Insufficient documentation

## 2011-08-11 DIAGNOSIS — D219 Benign neoplasm of connective and other soft tissue, unspecified: Secondary | ICD-10-CM

## 2011-08-11 DIAGNOSIS — D251 Intramural leiomyoma of uterus: Secondary | ICD-10-CM | POA: Insufficient documentation

## 2011-08-11 DIAGNOSIS — N8000 Endometriosis of the uterus, unspecified: Secondary | ICD-10-CM | POA: Insufficient documentation

## 2011-08-11 DIAGNOSIS — N72 Inflammatory disease of cervix uteri: Secondary | ICD-10-CM | POA: Insufficient documentation

## 2011-08-11 DIAGNOSIS — D259 Leiomyoma of uterus, unspecified: Secondary | ICD-10-CM

## 2011-08-11 DIAGNOSIS — Z90710 Acquired absence of both cervix and uterus: Secondary | ICD-10-CM

## 2011-08-11 HISTORY — DX: Benign neoplasm of connective and other soft tissue, unspecified: D21.9

## 2011-08-11 HISTORY — PX: LAPAROSCOPIC HYSTERECTOMY: SHX1926

## 2011-08-11 HISTORY — DX: Other seasonal allergic rhinitis: J30.2

## 2011-08-11 HISTORY — PX: CYSTOSCOPY: SHX5120

## 2011-08-11 SURGERY — HYSTERECTOMY, TOTAL, LAPAROSCOPIC
Anesthesia: General | Site: Urethra | Wound class: Clean Contaminated

## 2011-08-11 MED ORDER — PHENYLEPHRINE 40 MCG/ML (10ML) SYRINGE FOR IV PUSH (FOR BLOOD PRESSURE SUPPORT)
PREFILLED_SYRINGE | INTRAVENOUS | Status: AC
  Filled 2011-08-11: qty 5

## 2011-08-11 MED ORDER — DEXAMETHASONE SODIUM PHOSPHATE 10 MG/ML IJ SOLN
INTRAMUSCULAR | Status: AC
  Filled 2011-08-11: qty 1

## 2011-08-11 MED ORDER — ONDANSETRON HCL 4 MG/2ML IJ SOLN
INTRAMUSCULAR | Status: AC
  Filled 2011-08-11: qty 2

## 2011-08-11 MED ORDER — DEXAMETHASONE SODIUM PHOSPHATE 4 MG/ML IJ SOLN
INTRAMUSCULAR | Status: DC | PRN
  Administered 2011-08-11: 10 mg via INTRAVENOUS

## 2011-08-11 MED ORDER — BUPIVACAINE HCL (PF) 0.25 % IJ SOLN
INTRAMUSCULAR | Status: AC
  Filled 2011-08-11: qty 30

## 2011-08-11 MED ORDER — EPHEDRINE SULFATE 50 MG/ML IJ SOLN
INTRAMUSCULAR | Status: DC | PRN
  Administered 2011-08-11: 10 mg via INTRAVENOUS

## 2011-08-11 MED ORDER — LACTATED RINGERS IV SOLN
INTRAVENOUS | Status: DC
  Administered 2011-08-11: 11:00:00 via INTRAVENOUS
  Administered 2011-08-11: 125 mL/h via INTRAVENOUS
  Administered 2011-08-11: 10:00:00 via INTRAVENOUS

## 2011-08-11 MED ORDER — ROCURONIUM BROMIDE 100 MG/10ML IV SOLN
INTRAVENOUS | Status: DC | PRN
  Administered 2011-08-11: 40 mg via INTRAVENOUS
  Administered 2011-08-11: 10 mg via INTRAVENOUS
  Administered 2011-08-11: 5 mg via INTRAVENOUS
  Administered 2011-08-11: 10 mg via INTRAVENOUS

## 2011-08-11 MED ORDER — EPHEDRINE 5 MG/ML INJ
INTRAVENOUS | Status: AC
  Filled 2011-08-11: qty 10

## 2011-08-11 MED ORDER — MENTHOL 3 MG MT LOZG: 1.0000 | LOZENGE | OROMUCOSAL | Status: DC | PRN

## 2011-08-11 MED ORDER — ROCURONIUM BROMIDE 50 MG/5ML IV SOLN
INTRAVENOUS | Status: AC
  Filled 2011-08-11: qty 1

## 2011-08-11 MED ORDER — FENTANYL CITRATE 0.05 MG/ML IJ SOLN
INTRAMUSCULAR | Status: DC | PRN
  Administered 2011-08-11 (×2): 50 ug via INTRAVENOUS
  Administered 2011-08-11: 100 ug via INTRAVENOUS
  Administered 2011-08-11: 50 ug via INTRAVENOUS

## 2011-08-11 MED ORDER — INDIGOTINDISULFONATE SODIUM 8 MG/ML IJ SOLN
INTRAMUSCULAR | Status: DC | PRN
  Administered 2011-08-11: 40 mg via INTRAVENOUS

## 2011-08-11 MED ORDER — LACTATED RINGERS IR SOLN
Status: DC | PRN
  Administered 2011-08-11: 3000 mL

## 2011-08-11 MED ORDER — GLYCOPYRROLATE 0.2 MG/ML IJ SOLN
INTRAMUSCULAR | Status: DC | PRN
  Administered 2011-08-11: 0.4 mg via INTRAVENOUS

## 2011-08-11 MED ORDER — INDIGOTINDISULFONATE SODIUM 8 MG/ML IJ SOLN
INTRAMUSCULAR | Status: AC
  Filled 2011-08-11: qty 5

## 2011-08-11 MED ORDER — PHENYLEPHRINE HCL 10 MG/ML IJ SOLN
INTRAMUSCULAR | Status: DC | PRN
  Administered 2011-08-11: 80 ug via INTRAVENOUS
  Administered 2011-08-11: 40 ug via INTRAVENOUS
  Administered 2011-08-11 (×4): 80 ug via INTRAVENOUS

## 2011-08-11 MED ORDER — GLYCOPYRROLATE 0.2 MG/ML IJ SOLN
INTRAMUSCULAR | Status: AC
  Filled 2011-08-11: qty 1

## 2011-08-11 MED ORDER — DEXTROSE IN LACTATED RINGERS 5 % IV SOLN
INTRAVENOUS | Status: DC
  Administered 2011-08-11 (×2): via INTRAVENOUS

## 2011-08-11 MED ORDER — BUPIVACAINE HCL (PF) 0.25 % IJ SOLN
INTRAMUSCULAR | Status: DC | PRN
  Administered 2011-08-11: 9 mL

## 2011-08-11 MED ORDER — STERILE WATER FOR IRRIGATION IR SOLN
Status: DC | PRN
  Administered 2011-08-11: 1000 mL

## 2011-08-11 MED ORDER — PROPOFOL 10 MG/ML IV EMUL
INTRAVENOUS | Status: AC
  Filled 2011-08-11: qty 20

## 2011-08-11 MED ORDER — NEOSTIGMINE METHYLSULFATE 1 MG/ML IJ SOLN
INTRAMUSCULAR | Status: DC | PRN
  Administered 2011-08-11: 4 mg via INTRAVENOUS

## 2011-08-11 MED ORDER — PROPOFOL 10 MG/ML IV EMUL
INTRAVENOUS | Status: DC | PRN
  Administered 2011-08-11: 200 mg via INTRAVENOUS

## 2011-08-11 MED ORDER — NEOSTIGMINE METHYLSULFATE 1 MG/ML IJ SOLN
INTRAMUSCULAR | Status: AC
  Filled 2011-08-11: qty 10

## 2011-08-11 MED ORDER — IBUPROFEN 600 MG PO TABS: 600.0000 mg | ORAL_TABLET | Freq: Four times a day (QID) | ORAL | Status: DC | PRN

## 2011-08-11 MED ORDER — MIDAZOLAM HCL 2 MG/2ML IJ SOLN
INTRAMUSCULAR | Status: AC
  Filled 2011-08-11: qty 2

## 2011-08-11 MED ORDER — HYDROCODONE-ACETAMINOPHEN 5-325 MG PO TABS
1.0000 | ORAL_TABLET | ORAL | Status: DC | PRN
  Administered 2011-08-11: 2 via ORAL
  Administered 2011-08-11 (×2): 1 via ORAL
  Filled 2011-08-11 (×2): qty 1
  Filled 2011-08-11: qty 2

## 2011-08-11 MED ORDER — HYDROMORPHONE HCL PF 1 MG/ML IJ SOLN
INTRAMUSCULAR | Status: DC | PRN
  Administered 2011-08-11: 1 mg via INTRAVENOUS

## 2011-08-11 MED ORDER — MIDAZOLAM HCL 5 MG/5ML IJ SOLN
INTRAMUSCULAR | Status: DC | PRN
  Administered 2011-08-11: 2 mg via INTRAVENOUS

## 2011-08-11 MED ORDER — LIDOCAINE HCL (CARDIAC) 20 MG/ML IV SOLN
INTRAVENOUS | Status: AC
  Filled 2011-08-11: qty 5

## 2011-08-11 MED ORDER — DIPHENHYDRAMINE HCL 25 MG PO TABS
50.0000 mg | ORAL_TABLET | ORAL | Status: DC | PRN
  Filled 2011-08-11: qty 2

## 2011-08-11 MED ORDER — LIDOCAINE HCL (CARDIAC) 20 MG/ML IV SOLN
INTRAVENOUS | Status: DC | PRN
  Administered 2011-08-11: 100 mg via INTRAVENOUS

## 2011-08-11 MED ORDER — KETOROLAC TROMETHAMINE 30 MG/ML IJ SOLN: 30.0000 mg | Freq: Four times a day (QID) | INTRAMUSCULAR | Status: DC

## 2011-08-11 MED ORDER — ZOLPIDEM TARTRATE 5 MG PO TABS: 5.0000 mg | ORAL_TABLET | Freq: Every evening | ORAL | Status: DC | PRN

## 2011-08-11 MED ORDER — HYDROMORPHONE HCL PF 1 MG/ML IJ SOLN: 0.2500 mg | INTRAMUSCULAR | Status: DC | PRN

## 2011-08-11 MED ORDER — KETOROLAC TROMETHAMINE 30 MG/ML IJ SOLN
15.0000 mg | Freq: Once | INTRAMUSCULAR | Status: AC | PRN
  Administered 2011-08-11: 30 mg via INTRAVENOUS

## 2011-08-11 MED ORDER — OXYMETAZOLINE HCL 0.05 % NA SOLN: 1.0000 | Freq: Two times a day (BID) | NASAL | Status: DC | PRN

## 2011-08-11 MED ORDER — KETOROLAC TROMETHAMINE 30 MG/ML IJ SOLN
INTRAMUSCULAR | Status: AC
  Administered 2011-08-11: 30 mg via INTRAVENOUS
  Filled 2011-08-11: qty 1

## 2011-08-11 MED ORDER — KETOROLAC TROMETHAMINE 30 MG/ML IJ SOLN
30.0000 mg | Freq: Four times a day (QID) | INTRAMUSCULAR | Status: DC
  Administered 2011-08-11 – 2011-08-12 (×3): 30 mg via INTRAVENOUS
  Filled 2011-08-11 (×3): qty 1

## 2011-08-11 MED ORDER — FENTANYL CITRATE 0.05 MG/ML IJ SOLN
INTRAMUSCULAR | Status: AC
  Filled 2011-08-11: qty 5

## 2011-08-11 MED ORDER — HYDROMORPHONE HCL PF 1 MG/ML IJ SOLN
INTRAMUSCULAR | Status: AC
  Filled 2011-08-11: qty 1

## 2011-08-11 MED ORDER — ONDANSETRON HCL 4 MG/2ML IJ SOLN
INTRAMUSCULAR | Status: DC | PRN
  Administered 2011-08-11: 4 mg via INTRAVENOUS

## 2011-08-11 SURGICAL SUPPLY — 72 items
ADH SKN CLS APL DERMABOND .7 (GAUZE/BANDAGES/DRESSINGS) ×3
BARRIER ADHS 3X4 INTERCEED (GAUZE/BANDAGES/DRESSINGS) IMPLANT
BRR ADH 4X3 ABS CNTRL BYND (GAUZE/BANDAGES/DRESSINGS)
CANISTER SUCTION 2500CC (MISCELLANEOUS) ×4 IMPLANT
CATH FOLEY 3WAY  5CC 16FR (CATHETERS)
CATH FOLEY 3WAY 5CC 16FR (CATHETERS) IMPLANT
CHLORAPREP W/TINT 26ML (MISCELLANEOUS) ×4 IMPLANT
CLOTH BEACON ORANGE TIMEOUT ST (SAFETY) ×4 IMPLANT
CONT PATH 16OZ SNAP LID 3702 (MISCELLANEOUS) ×4 IMPLANT
COVER MAYO STAND STRL (DRAPES) ×4 IMPLANT
DECANTER SPIKE VIAL GLASS SM (MISCELLANEOUS) ×2 IMPLANT
DERMABOND ADVANCED (GAUZE/BANDAGES/DRESSINGS) ×1
DERMABOND ADVANCED .7 DNX12 (GAUZE/BANDAGES/DRESSINGS) ×3 IMPLANT
DISSECTOR BLUNT TIP ENDO 5MM (MISCELLANEOUS) IMPLANT
DISSECTOR SPONGE CHERRY (GAUZE/BANDAGES/DRESSINGS) IMPLANT
EVACUATOR SMOKE 8.L (FILTER) ×6 IMPLANT
GAUZE SPONGE 4X4 16PLY XRAY LF (GAUZE/BANDAGES/DRESSINGS) ×4 IMPLANT
GLOVE BIO SURGEON STRL SZ 6.5 (GLOVE) ×8 IMPLANT
GLOVE BIOGEL PI IND STRL 7.0 (GLOVE) ×3 IMPLANT
GLOVE BIOGEL PI IND STRL 7.5 (GLOVE) ×3 IMPLANT
GLOVE BIOGEL PI INDICATOR 7.0 (GLOVE) ×1
GLOVE BIOGEL PI INDICATOR 7.5 (GLOVE) ×1
GOWN PREVENTION PLUS LG XLONG (DISPOSABLE) ×12 IMPLANT
HEMOSTAT SURGICEL 2X14 (HEMOSTASIS) IMPLANT
NDL HYPO 25X1 1.5 SAFETY (NEEDLE) IMPLANT
NEEDLE HYPO 25X1 1.5 SAFETY (NEEDLE) ×4 IMPLANT
NS IRRIG 1000ML POUR BTL (IV SOLUTION) ×4 IMPLANT
OCCLUDER COLPOPNEUMO (BALLOONS) ×4 IMPLANT
PACK LAPAROSCOPY BASIN (CUSTOM PROCEDURE TRAY) ×4 IMPLANT
PACK LAVH (CUSTOM PROCEDURE TRAY) ×4 IMPLANT
PAD OB MATERNITY 4.3X12.25 (PERSONAL CARE ITEMS) ×4 IMPLANT
PROTECTOR NERVE ULNAR (MISCELLANEOUS) ×4 IMPLANT
SCALPEL HARMONIC ACE (MISCELLANEOUS) ×2 IMPLANT
SCISSORS LAP 5X35 DISP (ENDOMECHANICALS) IMPLANT
SET CYSTO W/LG BORE CLAMP LF (SET/KITS/TRAYS/PACK) IMPLANT
SET IRRIG TUBING LAPAROSCOPIC (IRRIGATION / IRRIGATOR) ×4 IMPLANT
SLEEVE ADV FIXATION 5X100MM (TROCAR) ×4 IMPLANT
SOLUTION ELECTROLUBE (MISCELLANEOUS) ×4 IMPLANT
SPONGE LAP 18X18 X RAY DECT (DISPOSABLE) ×8 IMPLANT
SPONGE SURGIFOAM ABS GEL 12-7 (HEMOSTASIS) IMPLANT
STAPLER VISISTAT 35W (STAPLE) IMPLANT
STRIP CLOSURE SKIN 1/2X4 (GAUZE/BANDAGES/DRESSINGS) ×4 IMPLANT
STRIP CLOSURE SKIN 1/4X4 (GAUZE/BANDAGES/DRESSINGS) IMPLANT
SUT CHROMIC 0 CT 1 (SUTURE) ×4 IMPLANT
SUT MNCRL AB 3-0 PS2 27 (SUTURE) ×4 IMPLANT
SUT MON AB 3-0 SH 27 (SUTURE) ×4
SUT MON AB 3-0 SH27 (SUTURE) ×3 IMPLANT
SUT PDS AB 0 CT1 27 (SUTURE) ×12 IMPLANT
SUT PDS AB 1 CT1 36 (SUTURE) IMPLANT
SUT PLAIN 2 0 XLH (SUTURE) ×4 IMPLANT
SUT VIC AB 0 CT1 18XCR BRD8 (SUTURE) ×9 IMPLANT
SUT VIC AB 0 CT1 27 (SUTURE) ×16
SUT VIC AB 0 CT1 27XBRD ANBCTR (SUTURE) ×12 IMPLANT
SUT VIC AB 0 CT1 8-18 (SUTURE) ×12
SUT VIC AB 2-0 SH 27 (SUTURE) ×4
SUT VIC AB 2-0 SH 27XBRD (SUTURE) ×3 IMPLANT
SUT VICRYL 0 TIES 12 18 (SUTURE) ×4 IMPLANT
SUT VICRYL 0 UR6 27IN ABS (SUTURE) ×8 IMPLANT
SYR 50ML LL SCALE MARK (SYRINGE) ×4 IMPLANT
SYR CONTROL 10ML LL (SYRINGE) ×2 IMPLANT
TIP UTERINE 5.1X6CM LAV DISP (MISCELLANEOUS) IMPLANT
TIP UTERINE 6.7X10CM GRN DISP (MISCELLANEOUS) IMPLANT
TIP UTERINE 6.7X6CM WHT DISP (MISCELLANEOUS) IMPLANT
TIP UTERINE 6.7X8CM BLUE DISP (MISCELLANEOUS) ×2 IMPLANT
TOWEL OR 17X24 6PK STRL BLUE (TOWEL DISPOSABLE) ×8 IMPLANT
TRAY FOLEY CATH 14FR (SET/KITS/TRAYS/PACK) ×2 IMPLANT
TROCAR BALLN 12MMX100 BLUNT (TROCAR) ×4 IMPLANT
TROCAR Z-THREAD FIOS 11X100 BL (TROCAR) ×8 IMPLANT
TROCAR Z-THREAD FIOS 5X100MM (TROCAR) ×4 IMPLANT
TUBING FILTER THERMOFLATOR (ELECTROSURGICAL) ×4 IMPLANT
WARMER LAPAROSCOPE (MISCELLANEOUS) ×4 IMPLANT
WATER STERILE IRR 1000ML POUR (IV SOLUTION) ×4 IMPLANT

## 2011-08-11 NOTE — Anesthesia Postprocedure Evaluation (Signed)
  Anesthesia Post-op Note  Patient: Allison Maldonado  Procedure(s) Performed: Procedure(s) (LRB): HYSTERECTOMY TOTAL LAPAROSCOPIC (N/A) CYSTOSCOPY (N/A)  Patient Location: [facility]  Anesthesia Type: General  Level of Consciousness: awake  Airway and Oxygen Therapy: Patient Spontanous Breathing  Post-op Pain: none  Post-op Assessment: Patient's Cardiovascular Status Stable and Respiratory Function Stable  Post-op Vital Signs: Reviewed and stable  Complications: No apparent anesthesia complications

## 2011-08-11 NOTE — Transfer of Care (Signed)
Immediate Anesthesia Transfer of Care Note  Patient: Allison Maldonado  Procedure(s) Performed: Procedure(s) (LRB): HYSTERECTOMY TOTAL LAPAROSCOPIC (N/A) CYSTOSCOPY (N/A)  Patient Location: PACU  Anesthesia Type: General  Level of Consciousness: awake, alert  and oriented  Airway & Oxygen Therapy: Patient Spontanous Breathing and Patient connected to nasal cannula oxygen  Post-op Assessment: Report given to PACU RN and Post -op Vital signs reviewed and stable  Post vital signs: stable  Complications: No apparent anesthesia complications

## 2011-08-11 NOTE — Anesthesia Preprocedure Evaluation (Signed)
Anesthesia Evaluation  Patient identified by MRN, date of birth, ID band Patient awake    Reviewed: Allergy & Precautions, H&P , NPO status , Patient's Chart, lab work & pertinent test results, reviewed documented beta blocker date and time   History of Anesthesia Complications Negative for: history of anesthetic complications  Airway Mallampati: I TM Distance: >3 FB Neck ROM: Full    Dental  (+) Teeth Intact,    Pulmonary neg pulmonary ROS,  breath sounds clear to auscultation  Pulmonary exam normal       Cardiovascular Exercise Tolerance: Good negative cardio ROS  Rhythm:regular Rate:Normal     Neuro/Psych negative neurological ROS  negative psych ROS   GI/Hepatic negative GI ROS, Neg liver ROS,   Endo/Other  negative endocrine ROS  Renal/GU negative Renal ROS  Female GU complaint     Musculoskeletal   Abdominal   Peds  Hematology negative hematology ROS (+)   Anesthesia Other Findings   Reproductive/Obstetrics negative OB ROS                           Anesthesia Physical Anesthesia Plan  ASA: II  Anesthesia Plan: General ETT   Post-op Pain Management:    Induction:   Airway Management Planned:   Additional Equipment:   Intra-op Plan:   Post-operative Plan:   Informed Consent: I have reviewed the patients History and Physical, chart, labs and discussed the procedure including the risks, benefits and alternatives for the proposed anesthesia with the patient or authorized representative who has indicated his/her understanding and acceptance.   Dental Advisory Given  Plan Discussed with: CRNA and Surgeon  Anesthesia Plan Comments:         Anesthesia Quick Evaluation

## 2011-08-11 NOTE — Interval H&P Note (Signed)
History and Physical Interval Note:  08/11/2011 8:51 AM  Allison Maldonado  has presented today for surgery, with the diagnosis of Symptomatic Fibroids  The various methods of treatment have been discussed with the patient and family. After consideration of risks, benefits and other options for treatment, the patient has consented to  Procedure(s) (LRB): HYSTERECTOMY TOTAL LAPAROSCOPIC (N/A) SALPINGO OOPHERECTOMY (Bilateral) HYSTERECTOMY ABDOMINAL (Bilateral) as a surgical intervention .  The patients' history has been reviewed, patient examined, no change in status, stable for surgery.  I have reviewed the patients' chart and labs.  Questions were answered to the patient's satisfaction.     [id number] A  Date of Initial H&P: 08/07/11  History reviewed, patient examined, no change in status, stable for surgery.

## 2011-08-11 NOTE — Op Note (Signed)
Preop Diagnosis: Symptomatic Fibroids   Postop Diagnosis: symptomatic fibroids   Procedure: HYSTERECTOMY TOTAL LAPAROSCOPIC LAPAROSCOPIC BILATERAL SALPINGO OOPHERECTOMY HYSTERECTOMY ABDOMINAL   Anesthesia: General   Anesthesiologist: Dana Allan, MD   Attending: Michael Litter, MD   Assistant: Osborn Coho MD  Findings:  Pathology:  Fluids:  UOP:  EBL:  Complications:  Procedure: The patient was taken to the operating room, placed under general anesthesia and prepped and draped in the normal sterile fashion. A Foley catheter was placed. The uterus sounded to 9cm. A weighted speculum and vaginal retractors were placed in the vagina. Tenaculum was placed on the anterior lip of the cervix.  A size 3.5cm tip was used and the rumi was placed, tip balloon and occluder insufflated. The co- ring was sutured to the cervix.   Attention was then turned to the abdomen. A 10 mm infraumbilical incision was made with the scalpel after 5 cc of 25% percent Marcaine was used for local anesthesia. The subcutaneous tissue was dissected and the fascia was incised with the knife. A purse string stitch was placed in the fascia and Hassan placed into the intra-abdominal cavity and anchored to the suture. Intraabdominal placement was confirmed with the laparoscope.  Two 5 mm trochars were placed in the right and left lower quadrants under direct visualization with the laparoscope.  The harmonic scalpel was used to cauterize and cut the uterine ovarian ligaments bilaterally.   Both round ligaments were cauterized and cut with the harmonic scalpel as well and the bladder flap created with the harmonic scalpel and removed away from the uterus. Both uterine arteries were cauterized and cut with harmonic scalpel. There was some bleeding on the pts left side from the uterine artery which was aldo made hemostatic with kleppengers with cautery The harmonic was used to circumscribe the [facility] ring and free the uterus and  cervix. The uterus was then pulled into the vagina. Both angles were sutured with 1 PDS.  The  remainder of the cuff was sutured with 1 PDS using interrupted stitches until the vaginal cuff was closed.  A total of 5 sutures were used.   Irrigation was performed.  Gas was allowed to leave the abdomen to check for bleeders and all pedicles were seen to be hemostatic the patient was given indigo carmine.  Cystoscopy was performed and both ureters were seen to efflux indigo carmine without difficulty. The bladder had full integrity with no suture or laceration visualized.  The vagina was inspected and the cuff was noted to be intact.  Attention was then turned back to the abdomen after removing top pair of gloves. The abdomen was reinsufflated with CO2 gas.   The abdomen and pelvis was copiously irrigated and  hemostasis was noted.  The 10 mm port on the patients left side was closed with 0 Vicryl using the fascial closure device.  All trochars were removed under direct visualization using the laparoscope.  The umbilical fascia was reapproximated by tying the circumferential suture. The two 10 mm incisions were closed with 3-0 Monocryl via a subcuticular stitch.  All remaining skin incisions were closed with Dermabond and the 10 mm skin incisions were reinforced using Dermabond.  Sponge lap and needle counts were correct.  The patient tolerated the procedure well and was returned to the PACU in stable condition

## 2011-08-12 ENCOUNTER — Encounter (HOSPITAL_COMMUNITY): Payer: Self-pay | Admitting: Obstetrics and Gynecology

## 2011-08-12 LAB — BASIC METABOLIC PANEL
Chloride: 105 mEq/L (ref 96–112)
GFR calc Af Amer: >90 mL/min (ref >90)
GFR calc non Af Amer: >90 mL/min (ref >90)
Potassium: 4 mEq/L (ref 3.5–5.1)
Sodium: 136 mEq/L (ref 135–145)

## 2011-08-12 LAB — CBC
Hemoglobin: 10.5 g/dL — ABNORMAL LOW (ref 12.0–15.0)
MCHC: 32.3 g/dL (ref 30.0–36.0)
RDW: 12.8 % (ref 11.5–15.5)
WBC: 12 10*3/uL — ABNORMAL HIGH (ref 4.0–10.5)

## 2011-08-12 MED ORDER — IBUPROFEN 600 MG PO TABS: ORAL_TABLET | ORAL | Status: DC

## 2011-08-12 MED ORDER — ONDANSETRON HCL 4 MG PO TABS: 4.0000 mg | ORAL_TABLET | Freq: Three times a day (TID) | ORAL | Status: AC | PRN

## 2011-08-12 MED ORDER — HYDROCODONE-ACETAMINOPHEN 5-325 MG PO TABS: 1.0000 | ORAL_TABLET | ORAL | Status: AC | PRN

## 2011-08-12 NOTE — Progress Notes (Signed)
Allison Maldonado is a50 y.o.  [id number]  Post Op Date # 1: S/P  TLH/Cystoscopy  Subjective: Patient is Doing well postoperatively. with good pain control. Ambulated last night without dizziness or lightheadedness. Tolerating liquids and has passed flatus.  Objective: Vital signs in last 24 hours: Temp:  [97.3 F (36.3 C)-98.9 F (37.2 C)] 97.3 F (36.3 C) (06/11 0630) Pulse Rate:  [66-96] 80  (06/11 0630) Resp:  [12-20] 17  (06/11 0630) BP: (89-118)/(54-85) 89/54 mmHg (06/11 0630) SpO2:  [95 %-100 %] 100 % (06/11 0630) Weight:  [200 lb (90.719 kg)] 200 lb (90.719 kg) (06/10 1415)  Intake/Output from previous day: 06/10 0701 - 06/11 0700 In: 5318.8 [P.O.:600; I.V.:4718.8] Out: 4000 [Urine:3850] Intake/Output this shift:    Lab 08/12/11 0530  WBC 12.0*  HGB 10.5*  HCT 32.5*  PLT 209     Lab 08/12/11 0530  NA 136  K 4.0  CL 105  CO2 28  BUN 7  CREATININE 0.64  CALCIUM 8.8  PROT --  BILITOT --  ALKPHOS --  ALT --  AST --  GLUCOSE 151*    EXAM: General: cooperative, fatigued and no distress Resp: clear to auscultation bilaterally Cardio: regular rate and rhythm, S1, S2 normal, no murmur, click, rub or gallop GI: soft, non-tender; bowel sounds normal; no masses,  no organomegaly and incisions intact without evidence of infection Extremities: Homans sign is negative, no sign of DVT or calf tenderness   Assessment: s/p Procedure(s): HYSTERECTOMY TOTAL LAPAROSCOPIC CYSTOSCOPY: stable, progressing well and anemia  Plan: Advance diet Discontinue IV fluids Discharge home Discontinue Foley  LOS: 1 day    Moya Duan, PA-C 08/12/2011 7:35 AM

## 2011-08-12 NOTE — Anesthesia Postprocedure Evaluation (Signed)
Anesthesia Post Note  Patient: Allison Maldonado  Procedure(s) Performed: Procedure(s) (LRB): HYSTERECTOMY TOTAL LAPAROSCOPIC (N/A) CYSTOSCOPY (N/A)  Anesthesia type: General  Patient location: PACU  Post pain: Pain level controlled  Post assessment: Post-op Vital signs reviewed  Post vital signs: Reviewed  Level of consciousness: sedated  Complications: No apparent anesthesia complications

## 2011-08-12 NOTE — Progress Notes (Signed)
Subjective: Patient reports incisional pain, tolerating PO, + flatus, + BM and no problems voiding.    Objective: I have reviewed patient's vital signs, intake and output, medications, labs, microbiology, pathology and radiology results.  General: alert Resp: clear to auscultation bilaterally Cardio: regular rate and rhythm, S1, S2 normal, no murmur, click, rub or gallop GI: soft, non-tender; bowel sounds normal; no masses,  no organomegaly Extremities: extremities normal, atraumatic, no cyanosis or edema Vaginal Bleeding: minimal   Assessment/Plan: S/PTLH and Cystoscopy Pt stable d/c home  LOS: 1 day    Delford Wingert A 08/12/2011, 10:20 AM

## 2011-08-12 NOTE — Discharge Instructions (Signed)
Call [facility], [phone] for:  Post operative appointment time and date Temperature greater than or equal to 100.4 degrees Farenheit orally Excessive pain not managed with your pain medications Excessive bleeding, problems urinating or other concerns   While taking pain medications, take Colace (Docusate Sodium) 100 mg 2-3 times daily until bowel movements are regular to prevent constipation.  You may shower post-operative day #1 You may walk up stairs You may drive after 2 weeks You may lift items greater than 20 pounds after 6 weeks You should not resume sexual activity until after 6 weeks  You may resume a  regular diet.  Increase the iron rich foods in your daily diet to restore the decrease in your iron levels from surgery.   Hysterectomy Care After Refer to this sheet in the next few weeks. These instructions provide you with information on caring for yourself after your procedure. Your caregiver may also give you more specific instructions. Your treatment has been planned according to current medical practices, but problems sometimes occur. Call your caregiver if you have any problems or questions after your procedure. HOME CARE INSTRUCTIONS  Healing will take time. You may have discomfort, tenderness, swelling, and bruising at the surgical site for about 2 weeks. This is normal and will get better as time goes on.  Only take over-the-counter or prescription medicines for pain, discomfort, or fever as directed by your caregiver.   Do not take aspirin. It can cause bleeding.   Do not drive when taking pain medicine.   Follow your caregiver's advice regarding exercise, lifting, driving, and general activities.   Resume your usual diet as directed and allowed.   Get plenty of rest and sleep.   Do not douche, use tampons, or have sexual intercourse for at least 6 weeks or until your caregiver gives you permission.   Change your bandages (dressings) as  directed by your caregiver.   Monitor your temperature.   Take showers instead of baths for 2 to 3 weeks.   Do not drink alcohol until your caregiver gives you permission.   If you are constipated, you may take a mild laxative with your caregiver's permission. Bran foods may help with constipation problems. Drinking enough fluids to keep your urine clear or pale yellow may help as well.   Try to have someone home with you for 1 or 2 weeks to help around the house.   Keep all of your follow-up appointments as directed by your caregiver.  SEEK MEDICAL CARE IF:   You have swelling, redness, or increasing pain in the surgical cut (incision) area.   You have pus coming from the incision.   You notice a bad smell coming from the incision or dressing.   You have swelling, redness, or pain around the intravenous (IV) site.   Your incision breaks open.   You feel dizzy or lightheaded.   You have pain or bleeding when you urinate.   You have persistent diarrhea.   You have persistent nausea and vomiting.   You have abnormal vaginal discharge.   You have a rash.   You have any type of abnormal reaction or develop an allergy to your medicine.   Your pain is not controlled with your prescribed medicine.  SEEK IMMEDIATE MEDICAL CARE IF:   You have a fever.   You have severe abdominal pain.   You have chest pain.   You have shortness of breath.   You faint.   You have pain,  swelling, or redness of your leg.   You have heavy vaginal bleeding with blood clots.  MAKE SURE YOU:  Understand these instructions.   Will watch your condition.   Will get help right away if you are not doing well or get worse.  Document Released: 09/06/2004 Document Revised: 02/06/2011 Document Reviewed: 10/04/2010 [facility] Patient Information 2012 [place], [place].

## 2011-08-12 NOTE — Discharge Summary (Signed)
  Physician Discharge Summary  Patient ID: Allison Maldonado MRN: [id number] DOB/AGE: 1960/11/30 51 y.o.  Admit date: 08/11/2011 Discharge date: 08/12/2011   Discharge Diagnoses: Dysfunctional Uterine Bleeding, Uterine Fibroids and S/P Novasure Endometrial Ablation Active Problems:  * No active hospital problems. *    Operation: Total Laparoscopic Hysterectomy with Cystoscopy   Discharged Condition: Stable-Progressing Well  Hospital Course: On the date of admission the patient underwent the aforementioned procedures tolerating all of them well.  Post operative course was unremarkable with patient tolerating a hemoglobin of 10.4 and resuming bowel and bladder function by post operative day #1.  Having received the maximum benefit of her hospital stay, the patient was discharged home on post operative day #1.  Disposition: 01-Home or Self Care  Discharge Medications:   Cheryel, Kyte  Home Medication Instructions [id number]   Printed on:08/12/11 0753  Medication Information                    diphenhydrAMINE (BENADRYL) 25 MG tablet Take 50 mg by mouth daily as needed. Allergies           oxymetazoline (AFRIN) 0.05 % nasal spray Place 1 spray into the nose daily as needed. Nasal stuffiness           bismuth subsalicylate (PEPTO BISMOL) 262 MG/15ML suspension Take 15 mLs by mouth daily as needed. For heartburn           ibuprofen (ADVIL,MOTRIN) 600 MG tablet 1 po pc q 6 hours x 5 days then prn           HYDROcodone-acetaminophen (NORCO) 5-325 MG per tablet Take 1-2 tablets by mouth every 4 (four) hours as needed.           ondansetron (ZOFRAN) 4 MG tablet Take 1 tablet (4 mg total) by mouth every 8 (eight) hours as needed for nausea.                Follow-up: Dr. Normand Sloop in six weeks     Signed: Patrick Jupiter 08/12/2011, 7:53 AM

## 2011-09-22 ENCOUNTER — Encounter: Payer: BC Managed Care – PPO | Admitting: Obstetrics and Gynecology

## 2011-09-23 ENCOUNTER — Encounter: Payer: Self-pay | Admitting: Obstetrics and Gynecology

## 2011-09-23 ENCOUNTER — Ambulatory Visit (INDEPENDENT_AMBULATORY_CARE_PROVIDER_SITE_OTHER): Payer: BC Managed Care – PPO | Admitting: Obstetrics and Gynecology

## 2011-09-23 VITALS — BP 112/68 | Temp 98.5°F | Wt 203.0 lb

## 2011-09-23 DIAGNOSIS — Z90710 Acquired absence of both cervix and uterus: Secondary | ICD-10-CM

## 2011-09-23 MED ORDER — CYCLOBENZAPRINE HCL 5 MG PO TABS: 5.0000 mg | ORAL_TABLET | Freq: Three times a day (TID) | ORAL | Status: AC | PRN

## 2011-09-23 NOTE — Addendum Note (Signed)
Addended by: Jaymes Graff on: 09/23/2011 12:43 PM   Modules accepted: Orders

## 2011-09-23 NOTE — Progress Notes (Signed)
Surgery: Laparoscopic Hysterectomy   Date: 08/11/2011  Eating a regular diet without difficulty. Bowel movements are normal.  Pain is controlled with current analgesics. Medications being used: ibuprofen (OTC).  Bladder function is returned to normal. Vaginal bleeding: none Vaginal discharge: pt stated this weekend had a brownish discharge, but nothing at this time . Pt. Stated having some stomach pain around her naval area and some pain in  her throat .    FINAL for Allison Maldonado, Allison Maldonado ([id number]) Patient Name: Allison Maldonado, Allison Maldonado Accession #: [id number] DOB: 04-Sep-1960 Age: 51 Gender: F Client Name [facility] Collected Date: 08/11/2011 Received Date: 08/11/2011 Physician: Samule Ohm Ema Hebner Chart #: MRN # : [id number] Physician cc: Race: B Visit #: [id number] REPORT OF SURGICAL PATHOLOGY FINAL DIAGNOSIS Diagnosis Uterus and cervix UTERINE CORPUS: ENDOMETRIUM: - PROLIFERATIVE PHASE ENDOMETRIUM. - NO HYPERPLASIA, ATYPIA OR MALIGNANCY IDENTIFIED. MYOMETRIUM: - LEIOMYOMATA. - ADENOMYOSIS. - NO ATYPIA OR MALIGNANCY IDENTIFIED. UTERINE CERVIX: - BENIGN TRANSFORMATION ZONE MUCOSA. - CHRONIC CERVICITIS PRESENT. - NO DYSPLASIA, ATYPIA OR MALIGNANCY IDENTIFIED. Allison Abts MD Pathologist, Electronic Signature (Case signed 08/12/2011) Specimen Gross and Clinical Information Specimen(s) Obtained: Uterus and cervix Specimen Clinical Information Symptomatic fibroids. (lw) Gross Specimen: Received in fixative is a uterus with attached cervix Specimen integrity (intact/incised/disrupted): Intact Size and shape: The uterine body is moderately deformed and nodular. It measures 12 cm from superior to inferior, 7 cm cornu-cornu and 6 cm from anterior to posterior Weight: 228 grams Serosa: Pink tan smooth and dull 1 of 2 FINAL for Allison Maldonado, Allison Maldonado ([id number]) Gross(continued) Cervix: 4 cm in diameter. The ectocervical mucosa is pink tan smooth, focally hyperemic and surrounds a 0.8 cm  ovoid os. The endocervical mucosa is hyperemic, velvety and folded Endometrium: The lining is pink tan smooth very focally hemorrhagic and measures 0.2 cm in thickness Myometrium: The underlying myometrium is pink tan rubbery and finely trabeculated. It is remarkable for multiple tan white whorled rubbery well circumscribed intramural leiomyomatous nodules measuring up to 1.5 cm. These nodule display no grossly appreciable areas of hemorrhage or necrosis. Right adnexa: Not present Left adnexa: Not present Block Summary: A- cervix B- full thickness endomyometrium C- endomyometrium D-E- leiomyomatous nodules / 5 blocks (JC:mw 08-11-11) Report signed out from the following location(s) [facility] [address], [place], [place] [address]. CLIA #: [id number], 2 of Pt with occ sharp shooting pain at her navel.  No n,v, f,c.  Also has lower back pain.  She hs h/o lower back strain BP 112/68  Temp 98.5 F (36.9 C)  Wt 203 lb (92.08 kg)  LMP 07/26/2011 Physical Examination: General appearance - alert, well appearing, and in no distress Abdomen - soft, nontender, nondistended, no masses or organomegaly Incisions CDI Pelvic - normal external genitalia, vulva, vagial cuff well healed.  Small dark discharge.  No adnexal masses Back exam - full range of motion.  Pt sore in her lower back.  NO CVAT B Flexeril prn RT to work and IC in one week.  Pt still with pain Extremities - peripheral pulses normal, no pedal edema, no clubbing or cyanosis

## 2011-12-23 ENCOUNTER — Encounter: Payer: Self-pay | Admitting: Obstetrics and Gynecology

## 2011-12-23 ENCOUNTER — Ambulatory Visit (INDEPENDENT_AMBULATORY_CARE_PROVIDER_SITE_OTHER): Payer: BC Managed Care – PPO | Admitting: Obstetrics and Gynecology

## 2011-12-23 VITALS — BP 132/90 | HR 88 | Ht 66.0 in | Wt 205.0 lb

## 2011-12-23 DIAGNOSIS — Z0000 Encounter for general adult medical examination without abnormal findings: Secondary | ICD-10-CM

## 2011-12-23 DIAGNOSIS — Z124 Encounter for screening for malignant neoplasm of cervix: Secondary | ICD-10-CM

## 2011-12-23 NOTE — Progress Notes (Signed)
Last Pap: 11/18/10 WNL: Yes Regular Periods:no Contraception: hysterectomy  Monthly Breast exam:yes Tetanus<35yrs:no Nl.Bladder Function:yes Daily BMs:yes Healthy Diet:yes Calcium:no Mammogram:yes Date of Mammogram: 12/05/11 Exercise:yes Have often Exercise: 3 times per week  Seatbelt: yes Abuse at home: no Stressful work:yes Sigmoid-colonoscopy: 2003 per pt Bone Density: No PCP: Dr. Sigmund Hazel  Change in PMH: none Change in [facility]: sister diagnosed with MS BP 132/90  Pulse 88  Ht 5\' 6"  (1.676 m)  Wt 205 lb (92.987 kg)  BMI 33.09 kg/m2  LMP 07/26/2011 Pt with complaints:no Physical Examination: General appearance - alert, well appearing, and in no distress Mental status - normal mood, behavior, speech, dress, motor activity, and thought processes Neck - supple, no significant adenopathy,  thyroid exam: thyroid is normal in size without nodules or tenderness Chest - clear to auscultation, no wheezes, rales or rhonchi, symmetric air entry Heart - normal rate and regular rhythm Abdomen - soft, nontender, nondistended, no masses or organomegaly Breasts - breasts appear normal, no suspicious masses, no skin or nipple changes or axillary nodes Pelvic - normal external genitalia, vulva, vagina,  and adnexa.  Uterus and cevix sugically absent Rectal - normal rectal, no masses Back exam - full range of motion, no tenderness, palpable spasm or pain on motion Neurological - alert, oriented, normal speech, no focal findings or movement disorder noted Musculoskeletal - no joint tenderness, deformity or swelling Extremities - no edema, redness or tenderness in the calves or thighs Skin - normal coloration and turgor, no rashes, no suspicious skin lesions noted Routine exam Pap sent yes last one if normal Mammogram due no hysterectomy used for contraception RT 1 yr Pt due for colonoscopy .  Told to f/u with GI

## 2011-12-23 NOTE — Addendum Note (Signed)
Addended by: Loralyn Freshwater on: 12/23/2011 04:48 PM   Modules accepted: Orders

## 2011-12-25 LAB — PAP IG W/ RFLX HPV ASCU

## 2012-03-24 ENCOUNTER — Other Ambulatory Visit: Payer: Self-pay | Admitting: Family Medicine

## 2012-03-24 ENCOUNTER — Telehealth: Payer: Self-pay | Admitting: Obstetrics and Gynecology

## 2012-03-24 DIAGNOSIS — E049 Nontoxic goiter, unspecified: Secondary | ICD-10-CM

## 2012-03-24 NOTE — Telephone Encounter (Signed)
ND pt 

## 2012-03-24 NOTE — Telephone Encounter (Signed)
Lm for Allison Maldonado at [facility] stating that there are no colonoscopy results in pt chart but that it was indicated at her visit on 11/18/10 that she had it done in 2006. No indication of where it was done.

## 2012-03-30 ENCOUNTER — Ambulatory Visit
Admission: RE | Admit: 2012-03-30 | Discharge: 2012-03-30 | Disposition: A | Discharge status: Home / Self Care | Payer: BC Managed Care – PPO | Source: Ambulatory Visit | Attending: Family Medicine | Admitting: Family Medicine

## 2012-03-30 DIAGNOSIS — E049 Nontoxic goiter, unspecified: Secondary | ICD-10-CM

## 2012-04-01 ENCOUNTER — Other Ambulatory Visit: Payer: Self-pay | Admitting: Family Medicine

## 2012-04-01 DIAGNOSIS — E041 Nontoxic single thyroid nodule: Secondary | ICD-10-CM

## 2012-04-08 ENCOUNTER — Other Ambulatory Visit (HOSPITAL_COMMUNITY)
Admission: RE | Admit: 2012-04-08 | Discharge: 2012-04-08 | Disposition: A | Discharge status: Home / Self Care | Payer: BC Managed Care – PPO | Source: Ambulatory Visit | Attending: Interventional Radiology | Admitting: Interventional Radiology

## 2012-04-08 ENCOUNTER — Ambulatory Visit
Admission: RE | Admit: 2012-04-08 | Discharge: 2012-04-08 | Disposition: A | Discharge status: Home / Self Care | Payer: BC Managed Care – PPO | Source: Ambulatory Visit | Attending: Family Medicine | Admitting: Family Medicine

## 2012-04-08 DIAGNOSIS — E041 Nontoxic single thyroid nodule: Secondary | ICD-10-CM

## 2012-04-08 DIAGNOSIS — E049 Nontoxic goiter, unspecified: Secondary | ICD-10-CM | POA: Insufficient documentation

## 2012-09-01 ENCOUNTER — Other Ambulatory Visit: Payer: Self-pay | Admitting: Family Medicine

## 2012-09-01 DIAGNOSIS — E049 Nontoxic goiter, unspecified: Secondary | ICD-10-CM

## 2012-09-10 ENCOUNTER — Ambulatory Visit
Admission: RE | Admit: 2012-09-10 | Discharge: 2012-09-10 | Disposition: A | Discharge status: Home / Self Care | Payer: BC Managed Care – PPO | Source: Ambulatory Visit | Attending: Family Medicine | Admitting: Family Medicine

## 2012-09-10 DIAGNOSIS — E049 Nontoxic goiter, unspecified: Secondary | ICD-10-CM

## 2012-10-28 ENCOUNTER — Other Ambulatory Visit: Payer: Self-pay | Admitting: Otolaryngology

## 2012-10-28 DIAGNOSIS — E049 Nontoxic goiter, unspecified: Secondary | ICD-10-CM

## 2013-08-23 ENCOUNTER — Emergency Department (HOSPITAL_COMMUNITY)
Admission: EM | Admit: 2013-08-23 | Discharge: 2013-08-23 | Disposition: A | Discharge status: Home / Self Care | Payer: BC Managed Care – PPO | Attending: Emergency Medicine | Admitting: Emergency Medicine

## 2013-08-23 ENCOUNTER — Emergency Department (HOSPITAL_COMMUNITY): Payer: BC Managed Care – PPO

## 2013-08-23 ENCOUNTER — Encounter (HOSPITAL_COMMUNITY): Payer: Self-pay | Admitting: Emergency Medicine

## 2013-08-23 DIAGNOSIS — Z87448 Personal history of other diseases of urinary system: Secondary | ICD-10-CM | POA: Insufficient documentation

## 2013-08-23 DIAGNOSIS — Z8679 Personal history of other diseases of the circulatory system: Secondary | ICD-10-CM | POA: Insufficient documentation

## 2013-08-23 DIAGNOSIS — Z8709 Personal history of other diseases of the respiratory system: Secondary | ICD-10-CM | POA: Insufficient documentation

## 2013-08-23 DIAGNOSIS — Z86718 Personal history of other venous thrombosis and embolism: Secondary | ICD-10-CM | POA: Insufficient documentation

## 2013-08-23 DIAGNOSIS — Z8742 Personal history of other diseases of the female genital tract: Secondary | ICD-10-CM | POA: Insufficient documentation

## 2013-08-23 DIAGNOSIS — R079 Chest pain, unspecified: Secondary | ICD-10-CM

## 2013-08-23 DIAGNOSIS — Z8639 Personal history of other endocrine, nutritional and metabolic disease: Secondary | ICD-10-CM | POA: Insufficient documentation

## 2013-08-23 DIAGNOSIS — R0789 Other chest pain: Secondary | ICD-10-CM | POA: Insufficient documentation

## 2013-08-23 DIAGNOSIS — R51 Headache: Secondary | ICD-10-CM | POA: Insufficient documentation

## 2013-08-23 DIAGNOSIS — Z8619 Personal history of other infectious and parasitic diseases: Secondary | ICD-10-CM | POA: Insufficient documentation

## 2013-08-23 DIAGNOSIS — Z862 Personal history of diseases of the blood and blood-forming organs and certain disorders involving the immune mechanism: Secondary | ICD-10-CM | POA: Insufficient documentation

## 2013-08-23 DIAGNOSIS — Z79899 Other long term (current) drug therapy: Secondary | ICD-10-CM | POA: Insufficient documentation

## 2013-08-23 DIAGNOSIS — Z8739 Personal history of other diseases of the musculoskeletal system and connective tissue: Secondary | ICD-10-CM | POA: Insufficient documentation

## 2013-08-23 LAB — CBC WITH DIFFERENTIAL/PLATELET
Basophils Absolute: 0 10*3/uL (ref 0.0–0.1)
Basophils Relative: 0 % (ref 0–1)
EOS PCT: 1 % (ref 0–5)
Eosinophils Absolute: 0.1 10*3/uL (ref 0.0–0.7)
HCT: 40.9 % (ref 36.0–46.0)
Hemoglobin: 13.7 g/dL (ref 12.0–15.0)
LYMPHS ABS: 3.8 10*3/uL (ref 0.7–4.0)
LYMPHS PCT: 38 % (ref 12–46)
MCH: 29.7 pg (ref 26.0–34.0)
MCHC: 33.5 g/dL (ref 30.0–36.0)
MCV: 88.7 fL (ref 78.0–100.0)
MONO ABS: 0.6 10*3/uL (ref 0.1–1.0)
Monocytes Relative: 6 % (ref 3–12)
Neutro Abs: 5.3 10*3/uL (ref 1.7–7.7)
Neutrophils Relative %: 55 % (ref 43–77)
Platelets: 254 10*3/uL (ref 150–400)
RBC: 4.61 MIL/uL (ref 3.87–5.11)
RDW: 12.8 % (ref 11.5–15.5)
WBC: 9.8 10*3/uL (ref 4.0–10.5)

## 2013-08-23 LAB — COMPREHENSIVE METABOLIC PANEL
ALT: 16 U/L (ref 0–35)
AST: 19 U/L (ref 0–37)
Albumin: 3.9 g/dL (ref 3.5–5.2)
Alkaline Phosphatase: 127 U/L — ABNORMAL HIGH (ref 39–117)
BUN: 10 mg/dL (ref 6–23)
CALCIUM: 9.8 mg/dL (ref 8.4–10.5)
CO2: 25 meq/L (ref 19–32)
CREATININE: 0.65 mg/dL (ref 0.50–1.10)
Chloride: 100 mEq/L (ref 96–112)
GLUCOSE: 97 mg/dL (ref 70–99)
Potassium: 4.3 mEq/L (ref 3.7–5.3)
Sodium: 137 mEq/L (ref 137–147)
Total Bilirubin: 0.3 mg/dL (ref 0.3–1.2)
Total Protein: 8.5 g/dL — ABNORMAL HIGH (ref 6.0–8.3)

## 2013-08-23 LAB — I-STAT TROPONIN, ED: Troponin i, poc: 0 ng/mL (ref 0.00–0.08)

## 2013-08-23 MED ORDER — MORPHINE SULFATE 4 MG/ML IJ SOLN: 4.0000 mg | Freq: Once | INTRAMUSCULAR | Status: DC

## 2013-08-23 MED ORDER — IOHEXOL 350 MG/ML SOLN
100.0000 mL | Freq: Once | INTRAVENOUS | Status: AC | PRN
  Administered 2013-08-23: 80 mL via INTRAVENOUS

## 2013-08-23 NOTE — ED Notes (Signed)
Pt was sent here from Dr. Dahlia Bailiff office with chest pain and shortness of breath. Negative EKG at office.  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

## 2013-08-23 NOTE — ED Provider Notes (Signed)
CSN: [id number]     Arrival date & time 08/23/13  1818 History   First MD Initiated Contact with Patient 08/23/13 1856     Chief Complaint  Patient presents with  . Chest Pain  . Shortness of Breath     (Consider location/radiation/quality/duration/timing/severity/associated sxs/prior Treatment) Patient is a 53 y.o. female presenting with chest pain. The history is provided by the patient.  Chest Pain Pain location:  L chest Pain quality: pressure   Pain radiates to:  Does not radiate Pain severity:  Moderate Onset quality:  Sudden Timing:  Constant Progression:  Waxing and waning Chronicity:  New Relieved by:  Nothing Worsened by:  Nothing tried Associated symptoms: headache (mild headache this AM. resolved with motrin. similar to prior. gradual. )   Associated symptoms: no abdominal pain, no cough, no dizziness, no fever, no nausea, no shortness of breath (adamantly denies SOB to me (although nursing note indicates patient endorsed some SOB previously)) and not vomiting     53 yo F pw chest pressure. Noticed it upon awakening this AM. Not had previously. Central and left side. Later with some tightness to left arm. Also with mild sharp left chest pains en route to ED. Not worsened with exertion. Not worse with deep breathing. No cough or fever. No leg swelling or pain. No personal or family h/o DVT/PE. States multiple family members have had though.  Went to PCP. Reportedly had elevated DIMER to 1.6. Also reportedly told that she has a problem with Factor V. Has not heard of this previously. Sent to ED for further mgmt.  No past cardiac history. Has otherwise been feeling well.    Past Medical History  Diagnosis Date  . SVD (spontaneous vaginal delivery)     x 2  . Seasonal allergies   . Fibroid 08/11/11  . Menometrorrhagia 10/26/2009  . H/O hematuria 11/16/2001  . H/O varicose veins   . History of blood clots   . H/O varicella   . Lumbago 11/16/2001  . History of back pain    . H/O seasonal allergies   . Thyromegaly 11/24/2006   Past Surgical History  Procedure Laterality Date  . Bilateral tubal ligation    . Cholecystectomy  2003  . Throat surgery    . Uterine ablation  2001  . Wisdom tooth extraction    . Laparoscopic hysterectomy  08/11/2011    Procedure: HYSTERECTOMY TOTAL LAPAROSCOPIC;  Surgeon: Betsy Coder, MD;  Location: [facility];  Service: Gynecology;  Laterality: N/A;  . Cystoscopy  08/11/2011    Procedure: CYSTOSCOPY;  Surgeon: Betsy Coder, MD;  Location: [facility];  Service: Gynecology;  Laterality: N/A;  . Tubal ligation  2000  . Breast surgery  1982-3    cyst from right   Family History  Problem Relation Age of Onset  . Depression Sister   . Multiple sclerosis Sister   . Cancer Brother   . Cancer Maternal Aunt    History  Substance Use Topics  . Smoking status: Never Smoker   . Smokeless tobacco: Never Used  . Alcohol Use: Yes     Comment: occasional   OB History   Grav Para Term Preterm Abortions TAB SAB Ect Mult Living   2 2 2       2      Review of Systems  Constitutional: Negative for fever and chills.  HENT: Negative for congestion and rhinorrhea.   Eyes: Negative for visual disturbance.  Respiratory: Negative for cough and shortness  of breath (adamantly denies SOB to me (although nursing note indicates patient endorsed some SOB previously)).   Cardiovascular: Positive for chest pain. Negative for leg swelling.  Gastrointestinal: Negative for nausea, vomiting, abdominal pain and diarrhea.  Genitourinary: Negative for dysuria, hematuria, flank pain and difficulty urinating.  Skin: Negative for color change and rash.  Neurological: Positive for headaches (mild headache this AM. resolved with motrin. similar to prior. gradual. ). Negative for dizziness.  All other systems reviewed and are negative.     Allergies  Shellfish allergy and Bextra  Home Medications   Prior to Admission medications   Medication Sig Start  Date End Date Taking? Authorizing Provider  Ascorbic Acid (VITAMIN C PO) Take 1 tablet by mouth daily.   Yes Historical Provider, MD  bismuth subsalicylate (PEPTO BISMOL) 262 MG/15ML suspension Take 15 mLs by mouth daily as needed. For heartburn   Yes Historical Provider, MD  diphenhydrAMINE (BENADRYL) 25 MG tablet Take 50 mg by mouth daily as needed. Allergies   Yes Historical Provider, MD  ibuprofen (ADVIL,MOTRIN) 200 MG tablet Take 600 mg by mouth every 6 (six) hours as needed.   Yes Historical Provider, MD  Multiple Vitamins-Calcium (ONE-A-DAY WOMENS PO) Take 1 tablet by mouth daily.   Yes Historical Provider, MD  OVER THE COUNTER MEDICATION Take 2 tablets by mouth daily. Gummy, Hair and Nail vitamin   Yes Historical Provider, MD  oxymetazoline (AFRIN) 0.05 % nasal spray Place 1 spray into the nose daily as needed. Nasal stuffiness   Yes Historical Provider, MD   BP 155/86  Pulse 85  Temp(Src) 98.6 F (37 C) (Oral)  Resp 20  Wt 212 lb (96.163 kg)  SpO2 98%  LMP 07/26/2011 Physical Exam  Nursing note and vitals reviewed. Constitutional: She is oriented to person, place, and time. She appears well-developed and well-nourished. No distress.  Sitting up in bed. NAD. Speaks in full sentences.  HENT:  Head: Normocephalic and atraumatic.  Eyes: Conjunctivae are normal. Right eye exhibits no discharge. Left eye exhibits no discharge.  Cardiovascular: Normal rate, regular rhythm, normal heart sounds and intact distal pulses.   Pulmonary/Chest: Effort normal and breath sounds normal. No respiratory distress. She has no wheezes. She has no rales. She exhibits no tenderness.  Abdominal: Soft. She exhibits no distension. There is no tenderness.  Musculoskeletal: She exhibits no edema and no tenderness.  Neurological: She is alert and oriented to person, place, and time.  Skin: Skin is warm and dry.  Psychiatric: She has a normal mood and affect. Her behavior is normal.    ED Course   Procedures (including critical care time) Labs Review Labs Reviewed  COMPREHENSIVE METABOLIC PANEL - Abnormal; Notable for the following:    Total Protein 8.5 (*)    Alkaline Phosphatase 127 (*)    All other components within normal limits  CBC WITH DIFFERENTIAL  Randolm Idol, ED    Imaging Review Dg Chest 2 View  08/23/2013   CLINICAL DATA:  Shortness of breath  EXAM: CHEST  2 VIEW  COMPARISON:  05/13/2012  FINDINGS: The heart size and mediastinal contours are within normal limits. Both lungs are clear. The visualized skeletal structures are unremarkable.  IMPRESSION: No active cardiopulmonary disease.   Electronically Signed   By: Skipper Cliche M.D.   On: 08/23/2013 19:42   Ct Angio Chest Pe W/cm &/or Wo Cm  08/23/2013   CLINICAL DATA:  Shortness of breath, chest pain, elevated D-dimer, familial history of blood clots, evaluate for  pulmonary embolism  EXAM: CT ANGIOGRAPHY CHEST WITH CONTRAST  TECHNIQUE: Multidetector CT imaging of the chest was performed using the standard protocol during bolus administration of intravenous contrast. Multiplanar CT image reconstructions and MIPs were obtained to evaluate the vascular anatomy.  CONTRAST:  30mL OMNIPAQUE IOHEXOL 350 MG/ML SOLN  COMPARISON:  Chest radiograph - 08/23/2013; 05/13/2012  FINDINGS: Vascular Findings:  There is adequate opacification of the pulmonary arterial system with the main pulmonary artery measuring 290 Hounsfield units. There are no discrete filling defects within the pulmonary arterial tree to the level of the bilateral subsegmental pulmonary arteries. Evaluation of distal subsegmental pulmonary arteries is degraded secondary to suboptimal vessel opacification as well as quantum mottle artifact from patient body habitus. Normal caliber of the main pulmonary artery.  Normal heart size.  No pericardial effusion.  Normal caliber of the thoracic aorta. Conventional configuration of the aortic arch. The branch vessels of the  aortic arch are widely patent throughout their imaged course. No definite thoracic aortic dissection or periaortic stranding.  Review of the MIP images confirms the above findings.   ----------------------------------------------------------------------------------  Nonvascular Findings:  There is minimal slightly asymmetric ground-glass subpleural atelectasis, right greater than left. No discrete focal airspace opacities. No pleural effusion or pneumothorax. The central pulmonary airways are widely patent. No discrete pulmonary nodules.  Scattered shotty mediastinal lymph nodes are individually not enlarged by size criteria with index AP window lymph node measuring 0.6 cm in greatest short axis diameter. No mediastinal, hilar axillary lymphadenopathy.  There is apparent diffuse enlargement of the left lobe of the thyroid with a suspected approximately 3.1 x 2.6 cm exophytic nodule arising from the caudal aspects of the left lobe of the thyroid (image 9, series 5).  Limited early arterial phase evaluation of the upper abdomen demonstrates hiatal hernia and the sequela of prior cholecystectomy.  No acute or aggressive osseus abnormalities. 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  There is asymmetric breast tissue within the upper outer quadrant of the left breast (representative image 38, series 5) which is associated with shotty left axillary lymph nodes which are individually not enlarged by size criteria though asymmetrically more new burr wrist min the right axilla with index left axillary node measuring 0.7 cm in greatest short axis diameter (image 30, series 5).  IMPRESSION: 1. No acute cardiopulmonary disease. Specifically, no evidence of pulmonary embolism to the level of the bilateral subsegmental pulmonary arteries. 2. Suspected approximately 3.1 cm nodule arising from the caudal aspect of the left lobe of the thyroid. Further evaluation with nonemergent thyroid ultrasound  is recommended. 3. Hiatal hernia. 4. Asymmetric breast tissue within the upper outer quadrant of the left breast with associated shotty though numerous left axillary lymph nodes. Correlation with breast exam is recommended. Additionally, please ensure screening mammograms are up-to-date.   Electronically Signed   By: Sandi Mariscal M.D.   On: 08/23/2013 20:28     EKG Interpretation   Date/Time:  Tuesday August 23 2013 18:21:58 EDT Ventricular Rate:  78 PR Interval:  156 QRS Duration: 80 QT Interval:  392 QTC Calculation: 446 R Axis:   38 Text Interpretation:  Sinus rhythm with frequent Premature ventricular  complexes in a pattern of bigeminy Cannot rule out Anterior infarct , age  undetermined Abnormal ECG When compared with ECG of 04/16/2001, Premature  ventricular complexes in a pattern of bigeminy is now Present Confirmed by  [facility]  MD, DAVID ([id number]) on 08/23/2013 6:35:19 PM  MDM   Final diagnoses:  Chest pain, unspecified chest pain type    CP. Atypical. Reported positive dimer. CTA here negative for PE.  EKG reassuring. Troponin negative. Low risk for ACS. Do not believe further labs or testing indicated at this time. To f/u closely with PCP.  Troponin obtained multiple hours after patient's chest pressure resolved. Patient remains well appearing. Sitting up in bed. Speaks in full sentences.    Patient discharged home. Return precautions given. To follow up with pcp. patient in agreement with plan.  Labs and imaging reviewed by myself and considered in medical decision making if ordered. Imaging interpreted by radiology.   Discussed case with Dr. Roxanne Mins who is in agreement with assessment and plan.      Bonnita Hollow, MD 08/26/13 [phone]

## 2013-08-23 NOTE — Discharge Instructions (Signed)
Please return for worsening of symptoms, difficulty breathing, or any other concerning symptoms. Please see your physician as discussed.    Chest Pain (Nonspecific) It is often hard to give a specific diagnosis for the cause of chest pain. There is always a chance that your pain could be related to something serious, such as a heart attack or a blood clot in the lungs. You need to follow up with your health care provider for further evaluation. CAUSES   Heartburn.  Pneumonia or bronchitis.  Anxiety or stress.  Inflammation around your heart (pericarditis) or lung (pleuritis or pleurisy).  A blood clot in the lung.  A collapsed lung (pneumothorax). It can develop suddenly on its own (spontaneous pneumothorax) or from trauma to the chest.  Shingles infection (herpes zoster virus). The chest wall is composed of bones, muscles, and cartilage. Any of these can be the source of the pain.  The bones can be bruised by injury.  The muscles or cartilage can be strained by coughing or overwork.  The cartilage can be affected by inflammation and become sore (costochondritis). DIAGNOSIS  Lab tests or other studies may be needed to find the cause of your pain. Your health care provider may have you take a test called an ambulatory electrocardiogram (ECG). An ECG records your heartbeat patterns over a 24-hour period. You may also have other tests, such as:  Transthoracic echocardiogram (TTE). During echocardiography, sound waves are used to evaluate how blood flows through your heart.  Transesophageal echocardiogram (TEE).  Cardiac monitoring. This allows your health care provider to monitor your heart rate and rhythm in real time.  Holter monitor. This is a portable device that records your heartbeat and can help diagnose heart arrhythmias. It allows your health care provider to track your heart activity for several days, if needed.  Stress tests by exercise or by giving medicine that makes  the heart beat faster. TREATMENT   Treatment depends on what may be causing your chest pain. Treatment may include:  Acid blockers for heartburn.  Anti-inflammatory medicine.  Pain medicine for inflammatory conditions.  Antibiotics if an infection is present.  You may be advised to change lifestyle habits. This includes stopping smoking and avoiding alcohol, caffeine, and chocolate.  You may be advised to keep your head raised (elevated) when sleeping. This reduces the chance of acid going backward from your stomach into your esophagus. Most of the time, nonspecific chest pain will improve within 2-3 days with rest and mild pain medicine.  HOME CARE INSTRUCTIONS   If antibiotics were prescribed, take them as directed. Finish them even if you start to feel better.  For the next few days, avoid physical activities that bring on chest pain. Continue physical activities as directed.  Do not use any tobacco products, including cigarettes, chewing tobacco, or electronic cigarettes.  Avoid drinking alcohol.  Only take medicine as directed by your health care provider.  Follow your health care provider's suggestions for further testing if your chest pain does not go away.  Keep any follow-up appointments you made. If you do not go to an appointment, you could develop lasting (chronic) problems with pain. If there is any problem keeping an appointment, call to reschedule. SEEK MEDICAL CARE IF:   Your chest pain does not go away, even after treatment.  You have a rash with blisters on your chest.  You have a fever. SEEK IMMEDIATE MEDICAL CARE IF:   You have increased chest pain or pain that spreads to  your arm, neck, jaw, back, or abdomen.  You have shortness of breath.  You have an increasing cough, or you cough up blood.  You have severe back or abdominal pain.  You feel nauseous or vomit.  You have severe weakness.  You faint.  You have chills. This is an emergency.  Do not wait to see if the pain will go away. Get medical help at once. Call your local emergency services (911 in [place] Do not drive yourself to the hospital. MAKE SURE YOU:   Understand these instructions.  Will watch your condition.  Will get help right away if you are not doing well or get worse. Document Released: 11/27/2004 Document Revised: 02/22/2013 Document Reviewed: 09/23/2007 [facility] Patient Information 2015 [place], [place]. This information is not intended to replace advice given to you by your health care provider. Make sure you discuss any questions you have with your health care provider.

## 2013-08-23 NOTE — ED Provider Notes (Signed)
53 year old female had in about a 30 minute episode this morning of a pressure feeling in her chest which then resolved. This afternoon she had some numbness in her left arm and this evening, she had 2 episodes of sharp pains in her left upper chest. She saw her PCP who ran a d-dimer which was elevated and she was sent here to rule out pulmonary embolism. On exam, lungs are clear and heart has regular rate and rhythm. There is no chest wall tenderness. Extremities have no cyanosis or edema. CT angiogram is negative for pulmonary embolism. She is given reassurance and is referred back to her PCP for further outpatient workup.  I saw and evaluated the patient, reviewed the resident's note and I agree with the findings and plan.   EKG Interpretation   Date/Time:  Tuesday August 23 2013 18:21:58 EDT Ventricular Rate:  78 PR Interval:  156 QRS Duration: 80 QT Interval:  392 QTC Calculation: 446 R Axis:   38 Text Interpretation:  Sinus rhythm with frequent Premature ventricular  complexes in a pattern of bigeminy Cannot rule out Anterior infarct , age  undetermined Abnormal ECG When compared with ECG of 04/16/2001, Premature  ventricular complexes in a pattern of bigeminy is now Present Confirmed by  Roxanne Mins  MD, DAVID ([id number]) on 08/23/2013 6:35:19 PM        Delora Fuel, MD 53/74/82 [id number]

## 2013-08-26 NOTE — ED Provider Notes (Signed)
  Patient contacted.  Doing well. No further chest pain. Is working on arranging pcp follow up.  Discussed incidentals noted on CT scan including thyroid nodule and breast findings. Patient already aware of thyroid nodule. Has had one removed. PCP monitoring. Has not removed at this point because of concern of damaging vocal cords. Patient endorses family h/o breast cancer and is followed closely by PCP for this. Gets regular mammograms. Next scheduled this fall.   Patient without any questions at this time.     Bonnita Hollow, MD 08/26/13 [phone]

## 2013-08-27 NOTE — ED Provider Notes (Signed)
Medical screening examination/treatment/procedure(s) were performed by non-physician practitioner and as supervising physician I was immediately available for consultation/collaboration.   Delora Fuel, MD 33/35/45 [id number]

## 2013-09-05 ENCOUNTER — Ambulatory Visit
Admission: RE | Admit: 2013-09-05 | Discharge: 2013-09-05 | Disposition: A | Discharge status: Home / Self Care | Payer: BC Managed Care – PPO | Source: Ambulatory Visit | Attending: Otolaryngology | Admitting: Otolaryngology

## 2013-09-05 DIAGNOSIS — E049 Nontoxic goiter, unspecified: Secondary | ICD-10-CM

## 2013-09-07 ENCOUNTER — Other Ambulatory Visit: Payer: Self-pay | Admitting: Otolaryngology

## 2014-01-02 ENCOUNTER — Encounter (HOSPITAL_COMMUNITY): Payer: Self-pay | Admitting: Emergency Medicine

## 2015-01-09 ENCOUNTER — Other Ambulatory Visit: Payer: Self-pay | Admitting: Family Medicine

## 2015-01-09 DIAGNOSIS — R1310 Dysphagia, unspecified: Secondary | ICD-10-CM

## 2015-01-12 ENCOUNTER — Ambulatory Visit
Admission: RE | Admit: 2015-01-12 | Discharge: 2015-01-12 | Disposition: A | Discharge status: Home / Self Care | Payer: BLUE CROSS/BLUE SHIELD | Source: Ambulatory Visit | Attending: Family Medicine | Admitting: Family Medicine

## 2015-01-12 ENCOUNTER — Encounter (INDEPENDENT_AMBULATORY_CARE_PROVIDER_SITE_OTHER): Payer: Self-pay

## 2015-01-12 ENCOUNTER — Other Ambulatory Visit: Payer: Self-pay | Admitting: Family Medicine

## 2015-01-12 DIAGNOSIS — R1310 Dysphagia, unspecified: Secondary | ICD-10-CM

## 2015-03-01 ENCOUNTER — Ambulatory Visit: Payer: Self-pay | Admitting: Otolaryngology

## 2015-03-01 NOTE — H&P (Signed)
  Assessment  Goiter (240.9) (E04.9). Discussed  She feels that her thyroid has enlarged even further. On exam it is firm and does appear to be larger than last year. She is also suffering with some reflux symptoms. Her voice is clear. No other masses palpable. Recommend we go ahead and proceed with completion thyroidectomy. Risks and benefits were discussed. Avoid caffeine and chocolate to help the reflux. Reason For Visit  Thyroid follow up. Allergies  Bextra TABS Shellfish. Current Meds  No Reported Medications;; RPT. 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 [place]  Cholecystectomy Hysterectomy Oral Surgery Tooth Extraction Thyroid Surgery Sub-Total Thyroidectomy; right Tubal Ligation. Family Hx  Family history of blood clots: Sister,Brother (V18.3) (Z82.49) Family history of lung cancer: Brother (V16.1) (Z80.1) Family history of malignant neoplasm of prostate: Father (V71.42) (Z49.42) Family history of migraine headaches: Daughter,Son (V17.2) (Z82.0) Family history of multiple sclerosis: Sister (V17.2) (Z82.0) Family history of stroke: Aunt (V17.1) (Z82.3) No pertinent family history: Mother Prostate cancer: Father Dannielle Huh). Personal Hx  Alcohol use (Z78.9); >2 drinks/ daily Caffeine use (F15.90); 1 cup daily Never smoker Non-smoker (V49.89) (Z78.9). Vital Signs  Adult Vitals Recorded by Iran Sizer on November 01,2016 02:09 PM BP: 120/82 mm Hg   Height: 5 ft 6 in, Weight: 200 lb , BMI: 32.28 , BSA: 2.00  . Signature  Electronically signed by : Izora Gala  M.D.; 01/02/2015 2:19 PM EST.

## 2015-03-06 NOTE — Pre-Procedure Instructions (Signed)
    Allison Maldonado  03/06/2015      [id number] [address] [place] [address] [place] [address] Phone: [phone] Fax: [phone]    Your procedure is scheduled on 03/14/15.  Report to [facility] Admitting at 8 A.M.  Call this number if you have problems the morning of surgery:  [phone]   Remember:  Do not eat food or drink liquids after midnight.  Take these medicines the morning of surgery with A SIP OF WATER --none   Do not wear jewelry, make-up or nail polish.  Do not wear lotions, powders, or perfumes.  You may wear deodorant.  Do not shave 48 hours prior to surgery.  Men may shave face and neck.  Do not bring valuables to the hospital.  [facility] is not responsible for any belongings or valuables.  Contacts, dentures or bridgework may not be worn into surgery.  Leave your suitcase in the car.  After surgery it may be brought to your room.  For patients admitted to the hospital, discharge time will be determined by your treatment team.  Patients discharged the day of surgery will not be allowed to drive home.   Name and phone number of your driver:   Special instructions:   Please read over the following fact sheets that you were given. Pain Booklet, Coughing and Deep Breathing and Surgical Site Infection Prevention

## 2015-03-07 ENCOUNTER — Encounter (HOSPITAL_COMMUNITY): Payer: Self-pay

## 2015-03-07 ENCOUNTER — Encounter (HOSPITAL_COMMUNITY)
Admission: RE | Admit: 2015-03-07 | Discharge: 2015-03-07 | Disposition: A | Discharge status: Home / Self Care | Payer: BLUE CROSS/BLUE SHIELD | Source: Ambulatory Visit | Attending: Otolaryngology | Admitting: Otolaryngology

## 2015-03-07 DIAGNOSIS — E049 Nontoxic goiter, unspecified: Secondary | ICD-10-CM | POA: Insufficient documentation

## 2015-03-07 DIAGNOSIS — I10 Essential (primary) hypertension: Secondary | ICD-10-CM

## 2015-03-07 DIAGNOSIS — K219 Gastro-esophageal reflux disease without esophagitis: Secondary | ICD-10-CM | POA: Diagnosis not present

## 2015-03-07 DIAGNOSIS — Z01818 Encounter for other preprocedural examination: Secondary | ICD-10-CM | POA: Diagnosis not present

## 2015-03-07 DIAGNOSIS — I517 Cardiomegaly: Secondary | ICD-10-CM | POA: Insufficient documentation

## 2015-03-07 DIAGNOSIS — Z01812 Encounter for preprocedural laboratory examination: Secondary | ICD-10-CM | POA: Insufficient documentation

## 2015-03-07 HISTORY — DX: Cardiac arrhythmia, unspecified: I49.9

## 2015-03-07 HISTORY — DX: Gastro-esophageal reflux disease without esophagitis: K21.9

## 2015-03-07 LAB — CBC
HCT: 41.7 % (ref 36.0–46.0)
Hemoglobin: 13.6 g/dL (ref 12.0–15.0)
MCH: 29.2 pg (ref 26.0–34.0)
MCHC: 32.6 g/dL (ref 30.0–36.0)
MCV: 89.5 fL (ref 78.0–100.0)
Platelets: 264 10*3/uL (ref 150–400)
RBC: 4.66 MIL/uL (ref 3.87–5.11)
RDW: 12.9 % (ref 11.5–15.5)
WBC: 8.4 10*3/uL (ref 4.0–10.5)

## 2015-03-07 LAB — BASIC METABOLIC PANEL
ANION GAP: 10 (ref 5–15)
BUN: 10 mg/dL (ref 6–20)
CO2: 25 mmol/L (ref 22–32)
Calcium: 9.6 mg/dL (ref 8.9–10.3)
Chloride: 105 mmol/L (ref 101–111)
Creatinine, Ser: 0.82 mg/dL (ref 0.44–1.00)
GFR calc Af Amer: >60 mL/min (ref >60)
GFR calc non Af Amer: >60 mL/min (ref >60)
Glucose, Bld: 118 mg/dL — ABNORMAL HIGH (ref 65–99)
Potassium: 3.9 mmol/L (ref 3.5–5.1)
Sodium: 140 mmol/L (ref 135–145)

## 2015-03-07 NOTE — Progress Notes (Signed)
Pt is unable to remember cardiac doc. I will call PCP   For past stress test.Lisa Sabra Heck

## 2015-03-09 NOTE — Progress Notes (Addendum)
Anesthesia Chart Review:  Pt is 55 year old female scheduled for completion L thyroidectomy on 03/14/2015 with Dr. Constance Holster.   PCP is Dr. Kathyrn Lass.   PMH includes:  Dysrhythmia, thyromegaly, GERD. Never smoker. BMI 36. S/p laparoscopic hysterectomy 08/11/11.   Preoperative labs reviewed.    Chest x-ray 03/07/15 reviewed. Cardiomegaly with mild pulmonary vascular prominence. No evidence of overt congestive heart failure.  EKG 03/07/15: NSR. Possible LAE.   Notes from PCP indicate pt had normal echo and stress test with Dr. Wynonia Lawman in 2015. Attempting to get records.   Willeen Cass, FNP-BC [facility] Stay [facility]/Anesthesiology Phone: [phone] 03/09/2015 3:05 PM  Addendum: Records from Dr. Wynonia Lawman received. Last visit 10/05/13 with PRN cardiology follow-up recommended.   08/30/13 Exercise Treadmill Test: Impressions:  1. Clinically and EKG negative for ischemia. 2. Reduced exercise capacity for age. 3. PVCs occurring at rest and not exercise. Recommendations: The patient has a negative ETT with no evidence of myocardial ischemia. She did have evidence of some PVCs at rest that could explain her symptoms of palpitations at night. An echocardiogram is to be done in the future (see below).   10/05/13 Echo: Conclusions: Mild concentric LVH with normal LV systolic function. Mild LAE.   TSH was 1.10 and Free T4 0.65 were WNL in 01/2015 at Dr. Ammie Ferrier office.   If no acute changes then I anticipate that she can proceed as planned.  George Hugh [facility] Short Stay Center/Anesthesiology Phone [phone] 03/12/2015 12:19 PM

## 2015-03-13 MED ORDER — CEFAZOLIN SODIUM-DEXTROSE 2-3 GM-% IV SOLR
2.0000 g | INTRAVENOUS | Status: DC
  Filled 2015-03-13: qty 50

## 2015-03-14 ENCOUNTER — Ambulatory Visit (HOSPITAL_COMMUNITY): Payer: BLUE CROSS/BLUE SHIELD | Admitting: Anesthesiology

## 2015-03-14 ENCOUNTER — Ambulatory Visit (HOSPITAL_COMMUNITY): Payer: BLUE CROSS/BLUE SHIELD | Admitting: Emergency Medicine

## 2015-03-14 ENCOUNTER — Observation Stay (HOSPITAL_COMMUNITY)
Admission: RE | Admit: 2015-03-14 | Discharge: 2015-03-15 | Disposition: A | Discharge status: Home / Self Care | Payer: BLUE CROSS/BLUE SHIELD | Source: Ambulatory Visit | Attending: Otolaryngology | Admitting: Otolaryngology

## 2015-03-14 ENCOUNTER — Encounter (HOSPITAL_COMMUNITY): Payer: Self-pay | Admitting: *Deleted

## 2015-03-14 ENCOUNTER — Encounter (HOSPITAL_COMMUNITY)
Admission: RE | Disposition: A | Discharge status: Home / Self Care | Payer: Self-pay | Source: Ambulatory Visit | Attending: Otolaryngology

## 2015-03-14 DIAGNOSIS — E042 Nontoxic multinodular goiter: Secondary | ICD-10-CM | POA: Diagnosis present

## 2015-03-14 DIAGNOSIS — K219 Gastro-esophageal reflux disease without esophagitis: Secondary | ICD-10-CM | POA: Insufficient documentation

## 2015-03-14 DIAGNOSIS — Z9889 Other specified postprocedural states: Secondary | ICD-10-CM

## 2015-03-14 DIAGNOSIS — E890 Postprocedural hypothyroidism: Secondary | ICD-10-CM

## 2015-03-14 HISTORY — PX: THYROIDECTOMY: SHX17

## 2015-03-14 LAB — CALCIUM
CALCIUM: 9.2 mg/dL (ref 8.9–10.3)
CALCIUM: 9.1 mg/dL (ref 8.9–10.3)

## 2015-03-14 SURGERY — THYROIDECTOMY
Anesthesia: General | Site: Neck | Laterality: Left

## 2015-03-14 MED ORDER — PROPOFOL 10 MG/ML IV BOLUS
INTRAVENOUS | Status: AC
  Filled 2015-03-14: qty 20

## 2015-03-14 MED ORDER — PROMETHAZINE HCL 25 MG/ML IJ SOLN
INTRAMUSCULAR | Status: AC
  Filled 2015-03-14: qty 1

## 2015-03-14 MED ORDER — HYDROCODONE-ACETAMINOPHEN 7.5-325 MG PO TABS: 1.0000 | ORAL_TABLET | Freq: Four times a day (QID) | ORAL | Status: AC | PRN

## 2015-03-14 MED ORDER — FENTANYL CITRATE (PF) 100 MCG/2ML IJ SOLN
INTRAMUSCULAR | Status: DC | PRN
  Administered 2015-03-14 (×4): 50 ug via INTRAVENOUS

## 2015-03-14 MED ORDER — LEVOTHYROXINE SODIUM 100 MCG PO TABS: 100.0000 ug | ORAL_TABLET | Freq: Every day | ORAL | Status: AC

## 2015-03-14 MED ORDER — PHENYLEPHRINE HCL 10 MG/ML IJ SOLN
10.0000 mg | INTRAVENOUS | Status: DC | PRN
  Administered 2015-03-14: 40 ug/min via INTRAVENOUS
  Administered 2015-03-14: 20 ug/min via INTRAVENOUS

## 2015-03-14 MED ORDER — DIPHENHYDRAMINE HCL 25 MG PO CAPS: 50.0000 mg | ORAL_CAPSULE | Freq: Four times a day (QID) | ORAL | Status: DC | PRN

## 2015-03-14 MED ORDER — IBUPROFEN 600 MG PO TABS: 600.0000 mg | ORAL_TABLET | Freq: Four times a day (QID) | ORAL | Status: DC | PRN

## 2015-03-14 MED ORDER — PROMETHAZINE HCL 25 MG RE SUPP: 25.0000 mg | Freq: Four times a day (QID) | RECTAL | Status: DC | PRN

## 2015-03-14 MED ORDER — PROMETHAZINE HCL 25 MG/ML IJ SOLN
6.2500 mg | INTRAMUSCULAR | Status: AC | PRN
  Administered 2015-03-14 (×2): 6.25 mg via INTRAVENOUS

## 2015-03-14 MED ORDER — HYDROCODONE-ACETAMINOPHEN 5-325 MG PO TABS
1.0000 | ORAL_TABLET | ORAL | Status: DC | PRN
  Administered 2015-03-14: 1 via ORAL
  Filled 2015-03-14: qty 1

## 2015-03-14 MED ORDER — LIDOCAINE HCL (CARDIAC) 20 MG/ML IV SOLN
INTRAVENOUS | Status: DC | PRN
  Administered 2015-03-14: 80 mg via INTRAVENOUS

## 2015-03-14 MED ORDER — DEXTROSE-NACL 5-0.9 % IV SOLN
INTRAVENOUS | Status: DC
  Administered 2015-03-14: 14:00:00 via INTRAVENOUS

## 2015-03-14 MED ORDER — LACTATED RINGERS IV SOLN
INTRAVENOUS | Status: DC
  Administered 2015-03-14 (×2): via INTRAVENOUS

## 2015-03-14 MED ORDER — FENTANYL CITRATE (PF) 250 MCG/5ML IJ SOLN
INTRAMUSCULAR | Status: AC
  Filled 2015-03-14: qty 5

## 2015-03-14 MED ORDER — HYDROMORPHONE HCL 1 MG/ML IJ SOLN
0.2500 mg | INTRAMUSCULAR | Status: DC | PRN
  Administered 2015-03-14: 0.5 mg via INTRAVENOUS

## 2015-03-14 MED ORDER — PROMETHAZINE HCL 25 MG RE SUPP: 25.0000 mg | Freq: Four times a day (QID) | RECTAL | Status: AC | PRN

## 2015-03-14 MED ORDER — MIDAZOLAM HCL 5 MG/5ML IJ SOLN
INTRAMUSCULAR | Status: DC | PRN
  Administered 2015-03-14: 2 mg via INTRAVENOUS

## 2015-03-14 MED ORDER — LIDOCAINE HCL (CARDIAC) 20 MG/ML IV SOLN
INTRAVENOUS | Status: AC
  Filled 2015-03-14: qty 5

## 2015-03-14 MED ORDER — NEOSTIGMINE METHYLSULFATE 10 MG/10ML IV SOLN
INTRAVENOUS | Status: DC | PRN
  Administered 2015-03-14: 5 mg via INTRAVENOUS

## 2015-03-14 MED ORDER — PROPOFOL 10 MG/ML IV BOLUS
INTRAVENOUS | Status: DC | PRN
  Administered 2015-03-14: 200 mg via INTRAVENOUS

## 2015-03-14 MED ORDER — PHENYLEPHRINE HCL 10 MG/ML IJ SOLN
INTRAMUSCULAR | Status: AC
  Filled 2015-03-14: qty 1

## 2015-03-14 MED ORDER — ONDANSETRON HCL 4 MG/2ML IJ SOLN
INTRAMUSCULAR | Status: DC | PRN
  Administered 2015-03-14: 4 mg via INTRAVENOUS

## 2015-03-14 MED ORDER — ROCURONIUM BROMIDE 100 MG/10ML IV SOLN
INTRAVENOUS | Status: DC | PRN
  Administered 2015-03-14: 40 mg via INTRAVENOUS
  Administered 2015-03-14: 10 mg via INTRAVENOUS

## 2015-03-14 MED ORDER — GLYCOPYRROLATE 0.2 MG/ML IJ SOLN
INTRAMUSCULAR | Status: DC | PRN
  Administered 2015-03-14: .8 mg via INTRAVENOUS

## 2015-03-14 MED ORDER — HYDROMORPHONE HCL 1 MG/ML IJ SOLN
INTRAMUSCULAR | Status: AC
  Filled 2015-03-14: qty 1

## 2015-03-14 MED ORDER — 0.9 % SODIUM CHLORIDE (POUR BTL) OPTIME
TOPICAL | Status: DC | PRN
  Administered 2015-03-14: 1000 mL

## 2015-03-14 MED ORDER — PROMETHAZINE HCL 25 MG PO TABS: 25.0000 mg | ORAL_TABLET | Freq: Four times a day (QID) | ORAL | Status: DC | PRN

## 2015-03-14 MED ORDER — DEXAMETHASONE SODIUM PHOSPHATE 4 MG/ML IJ SOLN
INTRAMUSCULAR | Status: DC | PRN
  Administered 2015-03-14: 8 mg via INTRAVENOUS

## 2015-03-14 SURGICAL SUPPLY — 41 items
ADH SKN CLS APL DERMABOND .7 (GAUZE/BANDAGES/DRESSINGS) ×1
BLADE SURG 15 STRL LF DISP TIS (BLADE) IMPLANT
BLADE SURG 15 STRL SS (BLADE)
CANISTER SUCTION 2500CC (MISCELLANEOUS) ×3 IMPLANT
CLEANER TIP ELECTROSURG 2X2 (MISCELLANEOUS) ×3 IMPLANT
CONT SPEC 4OZ CLIKSEAL STRL BL (MISCELLANEOUS) ×3 IMPLANT
CORDS BIPOLAR (ELECTRODE) ×3 IMPLANT
COVER SURGICAL LIGHT HANDLE (MISCELLANEOUS) ×3 IMPLANT
DERMABOND ADVANCED (GAUZE/BANDAGES/DRESSINGS) ×2
DERMABOND ADVANCED .7 DNX12 (GAUZE/BANDAGES/DRESSINGS) ×1 IMPLANT
DRAIN HEMOVAC 7FR (DRAIN) IMPLANT
DRAIN SNY 10 ROU (WOUND CARE) ×2 IMPLANT
DRAPE PROXIMA HALF (DRAPES) IMPLANT
ELECT COATED BLADE 2.86 ST (ELECTRODE) ×3 IMPLANT
ELECT REM PT RETURN 9FT ADLT (ELECTROSURGICAL) ×3
ELECTRODE REM PT RTRN 9FT ADLT (ELECTROSURGICAL) ×1 IMPLANT
EVACUATOR SILICONE 100CC (DRAIN) ×3 IMPLANT
FORCEPS BIPOLAR SPETZLER 8 1.0 (NEUROSURGERY SUPPLIES) ×3 IMPLANT
GAUZE SPONGE 4X4 16PLY XRAY LF (GAUZE/BANDAGES/DRESSINGS) ×2 IMPLANT
GLOVE BIO SURGEON STRL SZ 6.5 (GLOVE) ×1 IMPLANT
GLOVE BIO SURGEONS STRL SZ 6.5 (GLOVE) ×1
GLOVE ECLIPSE 7.5 STRL STRAW (GLOVE) ×3 IMPLANT
GOWN STRL REUS W/ TWL LRG LVL3 (GOWN DISPOSABLE) ×2 IMPLANT
GOWN STRL REUS W/TWL LRG LVL3 (GOWN DISPOSABLE) ×6
KIT BASIN OR (CUSTOM PROCEDURE TRAY) ×3 IMPLANT
KIT ROOM TURNOVER OR (KITS) ×3 IMPLANT
NDL 27GX1/2 REG BEVEL ECLIP (NEEDLE) IMPLANT
NEEDLE 27GAX1/2IN MONOJET (NEEDLE) ×3 IMPLANT
NEEDLE 27GX1/2 REG BEVEL ECLIP (NEEDLE) IMPLANT
NS IRRIG 1000ML POUR BTL (IV SOLUTION) ×3 IMPLANT
PAD ARMBOARD 7.5X6 YLW CONV (MISCELLANEOUS) ×4 IMPLANT
PENCIL FOOT CONTROL (ELECTRODE) ×3 IMPLANT
SHEARS HARMONIC 9CM CVD (BLADE) ×3 IMPLANT
STAPLER VISISTAT 35W (STAPLE) ×3 IMPLANT
SUT CHROMIC 3 0 SH 27 (SUTURE) ×2 IMPLANT
SUT CHROMIC 4 0 PS 2 18 (SUTURE) ×6 IMPLANT
SUT ETHILON 3 0 PS 1 (SUTURE) ×3 IMPLANT
SUT SILK 3 0 REEL (SUTURE) IMPLANT
SUT SILK 4 0 REEL (SUTURE) ×3 IMPLANT
TOWEL OR 17X24 6PK STRL BLUE (TOWEL DISPOSABLE) ×3 IMPLANT
TRAY ENT MC OR (CUSTOM PROCEDURE TRAY) ×3 IMPLANT

## 2015-03-14 NOTE — Progress Notes (Signed)
   ENT Progress Note: s/p Procedure(s): COMPLETION LEFT THYROIDECTOMY   Subjective: C/O h/a  Objective: Vital signs in last 24 hours: Temp:  [97.8 F (36.6 C)-98.3 F (36.8 C)] 97.8 F (36.6 C) (01/11 1721) Pulse Rate:  [67-82] 76 (01/11 1721) Resp:  [9-18] 16 (01/11 1721) BP: (109-164)/(53-74) 133/66 mmHg (01/11 1721) SpO2:  [94 %-100 %] 97 % (01/11 1721) Weight change:     Intake/Output from previous day:   Intake/Output this shift: Total I/O In: 700 [I.V.:700] Out: 90 [Drains:40; Blood:50]  Labs: No results for input(s): WBC, HGB, HCT, PLT in the last 72 hours.  Recent Labs  03/14/15 1220  CALCIUM 9.2    Studies/Results: No results found.   PHYSICAL EXAM: Inc intact, no swelling or erythema  Good voice, no airway concerns   Assessment/Plan: Stable postop Monitor JP output and serum Ca    Allison Maldonado 03/14/2015, 5:34 PM

## 2015-03-14 NOTE — Interval H&P Note (Signed)
History and Physical Interval Note:  03/14/2015 8:58 AM  Allison Maldonado  has presented today for surgery, with the diagnosis of TYROID GOITER  The various methods of treatment have been discussed with the patient and family. After consideration of risks, benefits and other options for treatment, the patient has consented to  Procedure(s): COMPLETION LEFT THYROIDECTOMY (Left) as a surgical intervention .  The patient's history has been reviewed, patient examined, no change in status, stable for surgery.  I have reviewed the patient's chart and labs.  Questions were answered to the patient's satisfaction.     Brandey Vandalen

## 2015-03-14 NOTE — Progress Notes (Signed)
Pt admitted from pacu this pm. Alert, oriented and able to voice needs. Denies pain and nausea at this time. Family at bedside

## 2015-03-14 NOTE — Anesthesia Preprocedure Evaluation (Signed)
Anesthesia Evaluation  Patient identified by MRN, date of birth, ID band Patient awake    Reviewed: Allergy & Precautions, NPO status , Patient's Chart, lab work & pertinent test results  Airway Mallampati: I  TM Distance: >3 FB Neck ROM: Full   Comment: Easily seen goiter Dental  (+) Teeth Intact   Pulmonary    breath sounds clear to auscultation       Cardiovascular + dysrhythmias  Rhythm:Regular Rate:Normal     Neuro/Psych    GI/Hepatic GERD  ,  Endo/Other    Renal/GU      Musculoskeletal Back pain   Abdominal   Peds  Hematology   Anesthesia Other Findings   Reproductive/Obstetrics                             Anesthesia Physical Anesthesia Plan  ASA: II  Anesthesia Plan: General   Post-op Pain Management:    Induction:   Airway Management Planned: Oral ETT  Additional Equipment:   Intra-op Plan:   Post-operative Plan: Extubation in OR  Informed Consent: I have reviewed the patients History and Physical, chart, labs and discussed the procedure including the risks, benefits and alternatives for the proposed anesthesia with the patient or authorized representative who has indicated his/her understanding and acceptance.   Dental advisory given  Plan Discussed with: CRNA and Surgeon  Anesthesia Plan Comments:         Anesthesia Quick Evaluation

## 2015-03-14 NOTE — Progress Notes (Signed)
1 po hydrocodone administered for c/o headache

## 2015-03-14 NOTE — Op Note (Signed)
OPERATIVE REPORT  DATE OF SURGERY: 03/14/2015  PATIENT:  Jackqulyn Livings,  55 y.o. female  PRE-OPERATIVE DIAGNOSIS:  THYROID GOITER  POST-OPERATIVE DIAGNOSIS:  THYROID GOITER  PROCEDURE:  Procedure(s): COMPLETION LEFT THYROIDECTOMY  SURGEON:  Beckie Salts, MD  ASSISTANTS: Jolene Provost PA  ANESTHESIA:   General   EBL:  50 ml  DRAINS: 10 Fr Round JP  LOCAL MEDICATIONS USED:  None  SPECIMEN:  Left thyroid lobe  COUNTS:  Correct  PROCEDURE DETAILS: The patient was taken to the operating room and placed on the operating table in the supine position. A shoulder roll was placed beneath the shoulder blades and the neck was extended. The neck was prepped and draped in a standard fashion.  The previous scar was outlined with a marking pen and was  Excised with electrocautery. Dissection was continued down through the platysma layer.  Self-retaining retractors were used throughout the case.  The midline fascia was divided.  The left lobe was exposed and the strap muscles were reflected off of it laterally towards the left. Because of the size of the left lobe of the strap muscles were partially transected to facilitate exposure. The left lobe was dissected off of the trachea and then dissected out completely. Initially the superior vasculature was identified and ligated using the harmonic dissector and divided. Bipolar cautery was used as needed as well for small vessels. The middle thyroid vein was ligated also and divided with the harmonic dissector. The inferior vasculature was treated in a similar fashion. As the gland was brought forward the recurrent nerve was identified and preserved. A suspected inferior parathyroid was identified and preserved with its blood supply. The lobe was dissected off of the trachea and sent in 2 pieces for pathologic evaluation. Hemostasis was completed. The wound was irrigated with saline and suctioned. The drain was placed through a separate stab incision  and secured inferiorly. The strap muscles were reapproximated on the left side.  The midline fascia was reapproximated, all of this with interrupted 3-0 chromic. The platysma layer was reapproximated in a similar fashion. A running subcuticular closure of 4-0 chromic was used and  Dermabond was used on the skin. The drain was charged. The patient was awakened extubated and transferred to recovery in stable condition.   PATIENT DISPOSITION:  To PACU, stable

## 2015-03-14 NOTE — H&P (View-Only) (Signed)
  Assessment  Goiter (240.9) (E04.9). Discussed  She feels that her thyroid has enlarged even further. On exam it is firm and does appear to be larger than last year. She is also suffering with some reflux symptoms. Her voice is clear. No other masses palpable. Recommend we go ahead and proceed with completion thyroidectomy. Risks and benefits were discussed. Avoid caffeine and chocolate to help the reflux. Reason For Visit  Thyroid follow up. Allergies  Bextra TABS Shellfish. Current Meds  No Reported Medications;; RPT. 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 Friedens  Cholecystectomy Hysterectomy Oral Surgery Tooth Extraction Thyroid Surgery Sub-Total Thyroidectomy; right Tubal Ligation. Family Hx  Family history of blood clots: Sister,Brother (V18.3) (Z82.49) Family history of lung cancer: Brother (V16.1) (Z80.1) Family history of malignant neoplasm of prostate: Father (V70.42) (Z43.42) Family history of migraine headaches: Daughter,Son (V17.2) (Z82.0) Family history of multiple sclerosis: Sister (V17.2) (Z82.0) Family history of stroke: Aunt (V17.1) (Z82.3) No pertinent family history: Mother Prostate cancer: Father Dannielle Huh). Personal Hx  Alcohol use (Z78.9); >2 drinks/ daily Caffeine use (F15.90); 1 cup daily Never smoker Non-smoker (V49.89) (Z78.9). Vital Signs  Adult Vitals Recorded by Iran Sizer on November 01,2016 02:09 PM BP: 120/82 mm Hg   Height: 5 ft 6 in, Weight: 200 lb , BMI: 32.28 , BSA: 2.00  . Signature  Electronically signed by : Izora Gala  M.D.; 01/02/2015 2:19 PM EST.

## 2015-03-14 NOTE — Anesthesia Postprocedure Evaluation (Signed)
Anesthesia Post Note  Patient: Allison Maldonado  Procedure(s) Performed: Procedure(s) (LRB): COMPLETION LEFT THYROIDECTOMY (Left)  Patient location during evaluation: PACU Anesthesia Type: General Level of consciousness: awake and alert Pain management: pain level controlled Vital Signs Assessment: post-procedure vital signs reviewed and stable Respiratory status: spontaneous breathing, nonlabored ventilation, respiratory function stable and patient connected to nasal cannula oxygen Cardiovascular status: blood pressure returned to baseline and stable Postop Assessment: no signs of nausea or vomiting Anesthetic complications: no    Last Vitals:  Filed Vitals:   03/14/15 1245 03/14/15 1310  BP: 124/74 122/64  Pulse: 76 76  Temp: 36.8 C 36.6 C  Resp: 14 16    Last Pain:  Filed Vitals:   03/14/15 1311  PainSc: Asleep                 Desarie Feild,JAMES TERRILL

## 2015-03-14 NOTE — Discharge Instructions (Signed)
You may shower and use soap and water. Do not use any creams, oils or ointment. ° °

## 2015-03-14 NOTE — Transfer of Care (Signed)
Immediate Anesthesia Transfer of Care Note  Patient: Allison Maldonado  Procedure(s) Performed: Procedure(s): COMPLETION LEFT THYROIDECTOMY (Left)  Patient Location: PACU  Anesthesia Type:General  Level of Consciousness: awake, alert , oriented and patient cooperative  Airway & Oxygen Therapy: Patient Spontanous Breathing and Patient connected to nasal cannula oxygen  Post-op Assessment: Report given to RN and Post -op Vital signs reviewed and stable  Post vital signs: Reviewed and stable  Last Vitals:  Filed Vitals:   03/14/15 0903 03/14/15 1108  BP: 164/53 118/58  Pulse: 82 75  Temp: 36.8 C 36.8 C  Resp: 18 9    Complications: No apparent anesthesia complications

## 2015-03-15 ENCOUNTER — Encounter (HOSPITAL_COMMUNITY): Payer: Self-pay | Admitting: Otolaryngology

## 2015-03-15 DIAGNOSIS — E042 Nontoxic multinodular goiter: Secondary | ICD-10-CM | POA: Diagnosis not present

## 2015-03-15 LAB — CALCIUM: Calcium: 9 mg/dL (ref 8.9–10.3)

## 2015-03-15 NOTE — Discharge Summary (Signed)
Physician Discharge Summary  Patient ID: Allison Maldonado MRN: [id number] DOB/AGE: [birth year] 55 y.o.  Admit date: 03/14/2015 Discharge date: 03/15/2015  Admission Diagnoses:Thyroid mass  Discharge Diagnoses:  Active Problems:   S/P thyroidectomy   Discharged Condition: good  Hospital Course: no complications  Consults: none  Significant Diagnostic Studies: none  Treatments: surgery: thyroid lobectomy  Discharge Exam: Blood pressure 115/86, pulse 78, temperature 98.2 F (36.8 C), temperature source Oral, resp. rate 18, height 5\' 6"  (1.676 m), weight 99.791 kg (220 lb), last menstrual period 07/26/2011, SpO2 94 %. PHYSICAL EXAM: Normal voice, incision excellent. JP removed.  Disposition: 01-Home or Self Care     Medication List    TAKE these medications        bismuth subsalicylate [id number] suspension  Commonly known as:  PEPTO BISMOL  Take 15 mLs by mouth daily as needed. For heartburn     diphenhydrAMINE 25 MG tablet  Commonly known as:  BENADRYL  Take 50 mg by mouth daily as needed. Allergies     HYDROcodone-acetaminophen 7.5-325 MG tablet  Commonly known as:  NORCO  Take 1 tablet by mouth every 6 (six) hours as needed for moderate pain.     ibuprofen 200 MG tablet  Commonly known as:  ADVIL,MOTRIN  Take 600 mg by mouth every 6 (six) hours as needed for mild pain.     levothyroxine 100 MCG tablet  Commonly known as:  SYNTHROID  Take 1 tablet (100 mcg total) by mouth daily.     ONE-A-DAY WOMENS PO  Take 1 tablet by mouth daily.     OVER THE COUNTER MEDICATION  Take 2 tablets by mouth daily. Gummy, Hair and Nail vitamin     oxymetazoline 0.05 % nasal spray  Commonly known as:  AFRIN  Place 1 spray into the nose daily as needed. Nasal stuffiness     promethazine 25 MG suppository  Commonly known as:  PHENERGAN  Place 1 suppository (25 mg total) rectally every 6 (six) hours as needed for nausea or vomiting.     VITAMIN C PO  Take 1 tablet by mouth  daily.           Follow-up Information    Follow up with Izora Gala, MD. Schedule an appointment as soon as possible for a visit in 1 week.   Specialty:  Otolaryngology   Contact information:   [address] [place] [address] [phone]       Signed: Izora Gala 03/15/2015, 8:39 AM

## 2015-11-16 ENCOUNTER — Other Ambulatory Visit: Payer: Self-pay | Admitting: Family Medicine

## 2015-11-16 DIAGNOSIS — M5441 Lumbago with sciatica, right side: Secondary | ICD-10-CM

## 2015-11-24 ENCOUNTER — Ambulatory Visit
Admission: RE | Admit: 2015-11-24 | Discharge: 2015-11-24 | Disposition: A | Discharge status: Home / Self Care | Payer: BLUE CROSS/BLUE SHIELD | Source: Ambulatory Visit | Attending: Family Medicine | Admitting: Family Medicine

## 2015-11-24 DIAGNOSIS — M5441 Lumbago with sciatica, right side: Secondary | ICD-10-CM

## 2015-12-13 ENCOUNTER — Ambulatory Visit (INDEPENDENT_AMBULATORY_CARE_PROVIDER_SITE_OTHER): Payer: BLUE CROSS/BLUE SHIELD | Admitting: Physical Medicine and Rehabilitation

## 2015-12-13 DIAGNOSIS — M47816 Spondylosis without myelopathy or radiculopathy, lumbar region: Secondary | ICD-10-CM | POA: Diagnosis not present

## 2015-12-13 DIAGNOSIS — M545 Low back pain: Secondary | ICD-10-CM

## 2015-12-13 DIAGNOSIS — M791 Myalgia: Secondary | ICD-10-CM | POA: Diagnosis not present

## 2016-01-14 ENCOUNTER — Telehealth (INDEPENDENT_AMBULATORY_CARE_PROVIDER_SITE_OTHER): Payer: Self-pay | Admitting: Physical Medicine and Rehabilitation

## 2016-01-14 NOTE — Telephone Encounter (Signed)
I cancelled patient's appointment per her request and sent a message to Dr. Ernestina Patches letting him know she said PT was helping.

## 2016-01-16 ENCOUNTER — Ambulatory Visit (INDEPENDENT_AMBULATORY_CARE_PROVIDER_SITE_OTHER): Payer: BLUE CROSS/BLUE SHIELD | Admitting: Physical Medicine and Rehabilitation

## 2017-07-30 ENCOUNTER — Encounter (HOSPITAL_COMMUNITY): Payer: Self-pay | Admitting: *Deleted

## 2017-07-30 ENCOUNTER — Emergency Department (HOSPITAL_COMMUNITY)
Admission: EM | Admit: 2017-07-30 | Discharge: 2017-07-30 | Disposition: A | Discharge status: Home / Self Care | Payer: BLUE CROSS/BLUE SHIELD | Attending: Emergency Medicine | Admitting: Emergency Medicine

## 2017-07-30 ENCOUNTER — Emergency Department (HOSPITAL_BASED_OUTPATIENT_CLINIC_OR_DEPARTMENT_OTHER): Payer: BLUE CROSS/BLUE SHIELD

## 2017-07-30 DIAGNOSIS — Z79899 Other long term (current) drug therapy: Secondary | ICD-10-CM | POA: Insufficient documentation

## 2017-07-30 DIAGNOSIS — M79609 Pain in unspecified limb: Secondary | ICD-10-CM | POA: Diagnosis not present

## 2017-07-30 DIAGNOSIS — Z9089 Acquired absence of other organs: Secondary | ICD-10-CM | POA: Diagnosis not present

## 2017-07-30 DIAGNOSIS — M7989 Other specified soft tissue disorders: Secondary | ICD-10-CM | POA: Diagnosis not present

## 2017-07-30 DIAGNOSIS — R609 Edema, unspecified: Secondary | ICD-10-CM | POA: Diagnosis not present

## 2017-07-30 DIAGNOSIS — R2242 Localized swelling, mass and lump, left lower limb: Secondary | ICD-10-CM | POA: Diagnosis present

## 2017-07-30 NOTE — ED Triage Notes (Signed)
Pt sent by [facility] for ultrasound of both legs for possible DVT. Pt complains of right leg swelling from calf to knee, left ankle swelling x 1 week.

## 2017-07-30 NOTE — ED Notes (Signed)
Called Pt in lobby for vital recheck, no response in lobby.

## 2017-07-30 NOTE — Progress Notes (Signed)
Preliminary notes--Bilateral lower extremities venous duplex exam completed. Limited study due to patient body habitus and sensitivity to compression procedure. Negative for DVT. Could not completely exclude calf veins thrombosis.   Hongying Juanita Streight (RDMS RVT) 07/30/17 7:56 PM

## 2017-07-30 NOTE — Discharge Instructions (Addendum)
Continue tylenol as needed for pain.   Keep leg elevated, use compression stockings.   You need to get repeat bilateral leg ultrasounds in a week if you calf is still painful or swollen.   See your doctor.   Return to ER if you have worse calf pain or swelling, calf redness, trouble breathing, chest pain.

## 2017-07-30 NOTE — ED Provider Notes (Signed)
[facility] DEPT Provider Note   CSN: [id number] Arrival date & time: 07/30/17  1630     History   Chief Complaint Chief Complaint  Patient presents with  . Leg Swelling    sent by MD for possible DVT    HPI Allison Maldonado is a 57 y.o. female hx of reflux, breast mass here with bilateral leg swelling. Patient states that she noticed some left leg swelling about a week ago. She states that the swelling got worse and she started having right leg swelling and pain for the last several days. Denies chest pain or shortness of breath. Went to [facility] and sent for DVT study. Of note, patient has breast mass and is scheduled for biopsy next week. Denies recent travel or history of blood clots.   The history is provided by the patient.    Past Medical History:  Diagnosis Date  . Dysrhythmia   . Fibroid 08/11/11  . GERD (gastroesophageal reflux disease)   . H/O hematuria 11/16/2001  . H/O seasonal allergies   . H/O varicella   . H/O varicose veins   . History of back pain   . History of blood clots   . Lumbago 11/16/2001  . Menometrorrhagia 10/26/2009  . Seasonal allergies   . SVD (spontaneous vaginal delivery)    x 2  . Thyromegaly 11/24/2006    Patient Active Problem List   Diagnosis Date Noted  . S/P thyroidectomy 03/14/2015    Past Surgical History:  Procedure Laterality Date  . ABDOMINAL HYSTERECTOMY    . Bilateral Tubal Ligation    . BREAST SURGERY  1982-3   cyst from right  . CHOLECYSTECTOMY  2003  . CYSTOSCOPY  08/11/2011   Procedure: CYSTOSCOPY;  Surgeon: Betsy Coder, MD;  Location: [facility];  Service: Gynecology;  Laterality: N/A;  . LAPAROSCOPIC HYSTERECTOMY  08/11/2011   Procedure: HYSTERECTOMY TOTAL LAPAROSCOPIC;  Surgeon: Betsy Coder, MD;  Location: [facility];  Service: Gynecology;  Laterality: N/A;  . THROAT SURGERY    . THYROID SURGERY    . THYROIDECTOMY Left 03/14/2015   Procedure: COMPLETION LEFT THYROIDECTOMY;   Surgeon: Izora Gala, MD;  Location: [facility];  Service: ENT;  Laterality: Left;  . TUBAL LIGATION  2000  . uterine ablation  2001  . WISDOM TOOTH EXTRACTION       OB History    Gravida  2   Para  2   Term  2   Preterm      AB      Living  2     SAB      TAB      Ectopic      Multiple      Live Births  2            Home Medications    Prior to Admission medications   Medication Sig Start Date End Date Taking? Authorizing Provider  levothyroxine (SYNTHROID) 100 MCG tablet Take 1 tablet (100 mcg total) by mouth daily. 03/14/15  Yes Izora Gala, MD  HYDROcodone-acetaminophen (NORCO) 7.5-325 MG tablet Take 1 tablet by mouth every 6 (six) hours as needed for moderate pain. Patient not taking: Reported on 07/30/2017 03/14/15   Izora Gala, MD  promethazine (PHENERGAN) 25 MG suppository Place 1 suppository (25 mg total) rectally every 6 (six) hours as needed for nausea or vomiting. Patient not taking: Reported on 07/30/2017 03/14/15   Izora Gala, MD    Family History Family  History  Problem Relation Age of Onset  . Cancer Brother   . Depression Sister   . Multiple sclerosis Sister   . Cancer Maternal Aunt     Social History Social History   Tobacco Use  . Smoking status: Never Smoker  . Smokeless tobacco: Never Used  Substance Use Topics  . Alcohol use: Yes    Comment: occasional  . Drug use: No     Allergies   Bextra [valdecoxib]; Shellfish allergy; and Ibuprofen   Review of Systems Review of Systems  Musculoskeletal:       Calf pain   All other systems reviewed and are negative.    Physical Exam Updated Vital Signs BP 128/82 (BP Location: Right Arm)   Pulse 77   Temp 98.3 F (36.8 C) (Oral)   Resp 16   LMP 07/26/2011   SpO2 98%   Physical Exam  Constitutional: She is oriented to person, place, and time. She appears well-developed.  HENT:  Head: Normocephalic.  Mouth/Throat: Oropharynx is clear and moist.  Eyes: Pupils are equal,  round, and reactive to light. Conjunctivae and EOM are normal.  Neck: Normal range of motion. Neck supple.  Cardiovascular: Normal rate, regular rhythm and normal heart sounds.  Pulmonary/Chest: Effort normal and breath sounds normal. No stridor. No respiratory distress.  Abdominal: Soft. Bowel sounds are normal. She exhibits no distension. There is no tenderness.  Musculoskeletal:  1+ edema bilaterally. Minimal calf tenderness bilaterally. No redness   Neurological: She is alert and oriented to person, place, and time.  Skin: Skin is warm.  Psychiatric: She has a normal mood and affect.  Nursing note and vitals reviewed.    ED Treatments / Results  Labs (all labs ordered are listed, but only abnormal results are displayed) Labs Reviewed - No data to display  EKG None  Radiology No results found.  Procedures Procedures (including critical care time)  Medications Ordered in ED Medications - No data to display   Initial Impression / Assessment and Plan / ED Course  I have reviewed the triage vital signs and the nursing notes.  Pertinent labs & imaging results that were available during my care of the patient were reviewed by me and considered in my medical decision making (see chart for details).     Allison Maldonado is a 57 y.o. female here with leg pain bilaterally. Likely venous insufficiency. Consider DVT as well. Will get DVT studies. No signs of PE.   8:57 PM Patient's DVT study showed no obvious proximal DVT. Unable to visualize calf veins. I discussed getting D-dimer and starting anticoagulation if positive vs repeat US in a week. She states that she is scheduled for breast biopsy in a week so can't take blood thinners anyway. She has PCP follow up. Told her that she needs a repeat bilateral leg [place] in a week if she still has calf pain or swelling. Recommend leg elevation, compression stockings. BP improved to 120s from 180s with no intervention.   Final Clinical  Impressions(s) / ED Diagnoses   Final diagnoses:  Peripheral edema    ED Discharge Orders    None       Drenda Freeze, MD 07/30/17 [phone]

## 2017-07-30 NOTE — ED Notes (Signed)
Bed: WA32 Expected date:  Expected time:  Means of arrival:  Comments: 

## 2017-08-04 ENCOUNTER — Other Ambulatory Visit: Payer: Self-pay | Admitting: Radiology

## 2018-08-25 DIAGNOSIS — Z6831 Body mass index (BMI) 31.0-31.9, adult: Secondary | ICD-10-CM | POA: Diagnosis not present

## 2018-08-25 DIAGNOSIS — M542 Cervicalgia: Secondary | ICD-10-CM | POA: Diagnosis not present

## 2018-08-25 DIAGNOSIS — Z13220 Encounter for screening for lipoid disorders: Secondary | ICD-10-CM | POA: Diagnosis not present

## 2018-08-25 DIAGNOSIS — R7303 Prediabetes: Secondary | ICD-10-CM | POA: Diagnosis not present

## 2018-08-25 DIAGNOSIS — E039 Hypothyroidism, unspecified: Secondary | ICD-10-CM | POA: Diagnosis not present

## 2018-10-30 DIAGNOSIS — Z20828 Contact with and (suspected) exposure to other viral communicable diseases: Secondary | ICD-10-CM | POA: Diagnosis not present

## 2018-12-14 DIAGNOSIS — Z23 Encounter for immunization: Secondary | ICD-10-CM | POA: Diagnosis not present

## 2019-02-14 DIAGNOSIS — Z1231 Encounter for screening mammogram for malignant neoplasm of breast: Secondary | ICD-10-CM | POA: Diagnosis not present

## 2019-02-18 DIAGNOSIS — Z1239 Encounter for other screening for malignant neoplasm of breast: Secondary | ICD-10-CM | POA: Diagnosis not present

## 2019-02-18 DIAGNOSIS — Z01419 Encounter for gynecological examination (general) (routine) without abnormal findings: Secondary | ICD-10-CM | POA: Diagnosis not present

## 2019-04-14 DIAGNOSIS — L03114 Cellulitis of left upper limb: Secondary | ICD-10-CM | POA: Diagnosis not present

## 2019-04-14 DIAGNOSIS — R7303 Prediabetes: Secondary | ICD-10-CM | POA: Diagnosis not present

## 2019-08-26 DIAGNOSIS — Z79899 Other long term (current) drug therapy: Secondary | ICD-10-CM | POA: Diagnosis not present

## 2019-08-26 DIAGNOSIS — R7303 Prediabetes: Secondary | ICD-10-CM | POA: Diagnosis not present

## 2019-08-26 DIAGNOSIS — Z13220 Encounter for screening for lipoid disorders: Secondary | ICD-10-CM | POA: Diagnosis not present

## 2019-08-26 DIAGNOSIS — E039 Hypothyroidism, unspecified: Secondary | ICD-10-CM | POA: Diagnosis not present

## 2019-08-26 DIAGNOSIS — R030 Elevated blood-pressure reading, without diagnosis of hypertension: Secondary | ICD-10-CM | POA: Diagnosis not present

## 2019-09-12 DIAGNOSIS — H401112 Primary open-angle glaucoma, right eye, moderate stage: Secondary | ICD-10-CM | POA: Diagnosis not present

## 2019-09-15 DIAGNOSIS — Z20822 Contact with and (suspected) exposure to covid-19: Secondary | ICD-10-CM | POA: Diagnosis not present

## 2019-09-15 DIAGNOSIS — J019 Acute sinusitis, unspecified: Secondary | ICD-10-CM | POA: Diagnosis not present

## 2019-09-15 DIAGNOSIS — Z03818 Encounter for observation for suspected exposure to other biological agents ruled out: Secondary | ICD-10-CM | POA: Diagnosis not present

## 2019-09-15 DIAGNOSIS — R05 Cough: Secondary | ICD-10-CM | POA: Diagnosis not present

## 2019-10-17 DIAGNOSIS — J40 Bronchitis, not specified as acute or chronic: Secondary | ICD-10-CM | POA: Diagnosis not present

## 2019-10-31 DIAGNOSIS — H401121 Primary open-angle glaucoma, left eye, mild stage: Secondary | ICD-10-CM | POA: Diagnosis not present

## 2019-12-01 DIAGNOSIS — Z23 Encounter for immunization: Secondary | ICD-10-CM | POA: Diagnosis not present

## 2019-12-15 DIAGNOSIS — H04123 Dry eye syndrome of bilateral lacrimal glands: Secondary | ICD-10-CM | POA: Diagnosis not present

## 2019-12-19 DIAGNOSIS — Z23 Encounter for immunization: Secondary | ICD-10-CM | POA: Diagnosis not present

## 2020-02-08 DIAGNOSIS — H401112 Primary open-angle glaucoma, right eye, moderate stage: Secondary | ICD-10-CM | POA: Diagnosis not present

## 2020-02-16 DIAGNOSIS — Z1231 Encounter for screening mammogram for malignant neoplasm of breast: Secondary | ICD-10-CM | POA: Diagnosis not present

## 2020-07-19 DIAGNOSIS — Z01419 Encounter for gynecological examination (general) (routine) without abnormal findings: Secondary | ICD-10-CM | POA: Diagnosis not present

## 2020-07-19 DIAGNOSIS — Z113 Encounter for screening for infections with a predominantly sexual mode of transmission: Secondary | ICD-10-CM | POA: Diagnosis not present

## 2020-07-19 DIAGNOSIS — Z1211 Encounter for screening for malignant neoplasm of colon: Secondary | ICD-10-CM | POA: Diagnosis not present

## 2020-08-13 DIAGNOSIS — H5213 Myopia, bilateral: Secondary | ICD-10-CM | POA: Diagnosis not present

## 2020-08-29 DIAGNOSIS — R7303 Prediabetes: Secondary | ICD-10-CM | POA: Diagnosis not present

## 2020-08-29 DIAGNOSIS — E039 Hypothyroidism, unspecified: Secondary | ICD-10-CM | POA: Diagnosis not present

## 2020-08-29 DIAGNOSIS — E669 Obesity, unspecified: Secondary | ICD-10-CM | POA: Diagnosis not present

## 2020-09-06 DIAGNOSIS — H401112 Primary open-angle glaucoma, right eye, moderate stage: Secondary | ICD-10-CM | POA: Diagnosis not present

## 2020-09-07 DIAGNOSIS — H401121 Primary open-angle glaucoma, left eye, mild stage: Secondary | ICD-10-CM | POA: Diagnosis not present

## 2020-09-07 DIAGNOSIS — R110 Nausea: Secondary | ICD-10-CM | POA: Diagnosis not present

## 2020-09-07 DIAGNOSIS — Z888 Allergy status to other drugs, medicaments and biological substances status: Secondary | ICD-10-CM | POA: Diagnosis not present

## 2020-09-07 DIAGNOSIS — Z91013 Allergy to seafood: Secondary | ICD-10-CM | POA: Diagnosis not present

## 2020-09-07 DIAGNOSIS — H401112 Primary open-angle glaucoma, right eye, moderate stage: Secondary | ICD-10-CM | POA: Diagnosis not present

## 2020-09-07 DIAGNOSIS — Z79899 Other long term (current) drug therapy: Secondary | ICD-10-CM | POA: Diagnosis not present

## 2020-09-07 DIAGNOSIS — Z886 Allergy status to analgesic agent status: Secondary | ICD-10-CM | POA: Diagnosis not present

## 2020-09-07 DIAGNOSIS — R030 Elevated blood-pressure reading, without diagnosis of hypertension: Secondary | ICD-10-CM | POA: Diagnosis not present

## 2020-09-07 DIAGNOSIS — H5711 Ocular pain, right eye: Secondary | ICD-10-CM | POA: Diagnosis not present

## 2020-09-07 DIAGNOSIS — I10 Essential (primary) hypertension: Secondary | ICD-10-CM | POA: Diagnosis not present

## 2020-09-07 DIAGNOSIS — R0789 Other chest pain: Secondary | ICD-10-CM | POA: Diagnosis not present

## 2020-09-07 DIAGNOSIS — R519 Headache, unspecified: Secondary | ICD-10-CM | POA: Diagnosis not present

## 2020-10-11 DIAGNOSIS — I10 Essential (primary) hypertension: Secondary | ICD-10-CM | POA: Diagnosis not present

## 2020-10-11 DIAGNOSIS — R519 Headache, unspecified: Secondary | ICD-10-CM | POA: Diagnosis not present

## 2020-10-15 DIAGNOSIS — H401121 Primary open-angle glaucoma, left eye, mild stage: Secondary | ICD-10-CM | POA: Diagnosis not present

## 2020-10-15 DIAGNOSIS — H401112 Primary open-angle glaucoma, right eye, moderate stage: Secondary | ICD-10-CM | POA: Diagnosis not present

## 2020-11-16 DIAGNOSIS — H401112 Primary open-angle glaucoma, right eye, moderate stage: Secondary | ICD-10-CM | POA: Diagnosis not present

## 2020-11-16 DIAGNOSIS — H401121 Primary open-angle glaucoma, left eye, mild stage: Secondary | ICD-10-CM | POA: Diagnosis not present

## 2020-12-19 DIAGNOSIS — H401121 Primary open-angle glaucoma, left eye, mild stage: Secondary | ICD-10-CM | POA: Diagnosis not present

## 2020-12-19 DIAGNOSIS — H401112 Primary open-angle glaucoma, right eye, moderate stage: Secondary | ICD-10-CM | POA: Diagnosis not present

## 2021-01-10 DIAGNOSIS — R7303 Prediabetes: Secondary | ICD-10-CM | POA: Diagnosis not present

## 2021-01-10 DIAGNOSIS — I10 Essential (primary) hypertension: Secondary | ICD-10-CM | POA: Diagnosis not present

## 2021-01-10 DIAGNOSIS — E039 Hypothyroidism, unspecified: Secondary | ICD-10-CM | POA: Diagnosis not present

## 2021-01-10 DIAGNOSIS — R1032 Left lower quadrant pain: Secondary | ICD-10-CM | POA: Diagnosis not present

## 2021-01-11 ENCOUNTER — Other Ambulatory Visit: Payer: Self-pay | Admitting: Family Medicine

## 2021-01-11 DIAGNOSIS — E7800 Pure hypercholesterolemia, unspecified: Secondary | ICD-10-CM

## 2021-01-11 DIAGNOSIS — R030 Elevated blood-pressure reading, without diagnosis of hypertension: Secondary | ICD-10-CM

## 2021-02-04 ENCOUNTER — Ambulatory Visit
Admission: RE | Admit: 2021-02-04 | Discharge: 2021-02-04 | Disposition: A | Discharge status: Home / Self Care | Payer: No Typology Code available for payment source | Source: Ambulatory Visit | Attending: Family Medicine | Admitting: Family Medicine

## 2021-02-04 DIAGNOSIS — E7800 Pure hypercholesterolemia, unspecified: Secondary | ICD-10-CM

## 2021-02-04 DIAGNOSIS — R030 Elevated blood-pressure reading, without diagnosis of hypertension: Secondary | ICD-10-CM

## 2021-03-09 DIAGNOSIS — Z1231 Encounter for screening mammogram for malignant neoplasm of breast: Secondary | ICD-10-CM | POA: Diagnosis not present

## 2021-04-02 DIAGNOSIS — H524 Presbyopia: Secondary | ICD-10-CM | POA: Diagnosis not present

## 2021-04-02 DIAGNOSIS — H04123 Dry eye syndrome of bilateral lacrimal glands: Secondary | ICD-10-CM | POA: Diagnosis not present

## 2021-04-02 DIAGNOSIS — H401121 Primary open-angle glaucoma, left eye, mild stage: Secondary | ICD-10-CM | POA: Diagnosis not present

## 2021-04-02 DIAGNOSIS — H401112 Primary open-angle glaucoma, right eye, moderate stage: Secondary | ICD-10-CM | POA: Diagnosis not present

## 2021-04-02 DIAGNOSIS — H25813 Combined forms of age-related cataract, bilateral: Secondary | ICD-10-CM | POA: Diagnosis not present

## 2021-05-31 DIAGNOSIS — Z6836 Body mass index (BMI) 36.0-36.9, adult: Secondary | ICD-10-CM | POA: Diagnosis not present

## 2021-05-31 DIAGNOSIS — M47816 Spondylosis without myelopathy or radiculopathy, lumbar region: Secondary | ICD-10-CM | POA: Diagnosis not present

## 2021-05-31 DIAGNOSIS — I10 Essential (primary) hypertension: Secondary | ICD-10-CM | POA: Diagnosis not present

## 2021-07-16 DIAGNOSIS — H401112 Primary open-angle glaucoma, right eye, moderate stage: Secondary | ICD-10-CM | POA: Diagnosis not present

## 2021-07-16 DIAGNOSIS — H401121 Primary open-angle glaucoma, left eye, mild stage: Secondary | ICD-10-CM | POA: Diagnosis not present

## 2021-07-24 DIAGNOSIS — Z6834 Body mass index (BMI) 34.0-34.9, adult: Secondary | ICD-10-CM | POA: Diagnosis not present

## 2021-07-24 DIAGNOSIS — Z1272 Encounter for screening for malignant neoplasm of vagina: Secondary | ICD-10-CM | POA: Diagnosis not present

## 2021-07-24 DIAGNOSIS — Z01419 Encounter for gynecological examination (general) (routine) without abnormal findings: Secondary | ICD-10-CM | POA: Diagnosis not present

## 2021-08-30 DIAGNOSIS — E039 Hypothyroidism, unspecified: Secondary | ICD-10-CM | POA: Diagnosis not present

## 2021-08-30 DIAGNOSIS — I10 Essential (primary) hypertension: Secondary | ICD-10-CM | POA: Diagnosis not present

## 2021-08-30 DIAGNOSIS — R7303 Prediabetes: Secondary | ICD-10-CM | POA: Diagnosis not present

## 2021-11-19 DIAGNOSIS — H401121 Primary open-angle glaucoma, left eye, mild stage: Secondary | ICD-10-CM | POA: Diagnosis not present

## 2021-11-19 DIAGNOSIS — H401112 Primary open-angle glaucoma, right eye, moderate stage: Secondary | ICD-10-CM | POA: Diagnosis not present

## 2022-02-17 DIAGNOSIS — E039 Hypothyroidism, unspecified: Secondary | ICD-10-CM | POA: Diagnosis not present

## 2022-02-17 DIAGNOSIS — G4710 Hypersomnia, unspecified: Secondary | ICD-10-CM | POA: Diagnosis not present

## 2022-02-17 DIAGNOSIS — I10 Essential (primary) hypertension: Secondary | ICD-10-CM | POA: Diagnosis not present

## 2022-02-17 DIAGNOSIS — R7303 Prediabetes: Secondary | ICD-10-CM | POA: Diagnosis not present

## 2022-03-07 DIAGNOSIS — G4719 Other hypersomnia: Secondary | ICD-10-CM | POA: Diagnosis not present

## 2022-03-07 DIAGNOSIS — E669 Obesity, unspecified: Secondary | ICD-10-CM | POA: Diagnosis not present

## 2022-03-07 DIAGNOSIS — I10 Essential (primary) hypertension: Secondary | ICD-10-CM | POA: Diagnosis not present

## 2022-03-24 DIAGNOSIS — Z1231 Encounter for screening mammogram for malignant neoplasm of breast: Secondary | ICD-10-CM | POA: Diagnosis not present

## 2022-04-08 DIAGNOSIS — G4733 Obstructive sleep apnea (adult) (pediatric): Secondary | ICD-10-CM | POA: Diagnosis not present

## 2022-04-08 DIAGNOSIS — I10 Essential (primary) hypertension: Secondary | ICD-10-CM | POA: Diagnosis not present

## 2022-04-29 DIAGNOSIS — R400 Somnolence: Secondary | ICD-10-CM | POA: Diagnosis not present

## 2022-04-29 DIAGNOSIS — G4733 Obstructive sleep apnea (adult) (pediatric): Secondary | ICD-10-CM | POA: Diagnosis not present

## 2022-07-11 DIAGNOSIS — H524 Presbyopia: Secondary | ICD-10-CM | POA: Diagnosis not present

## 2022-07-11 DIAGNOSIS — H25813 Combined forms of age-related cataract, bilateral: Secondary | ICD-10-CM | POA: Diagnosis not present

## 2022-07-11 DIAGNOSIS — H401121 Primary open-angle glaucoma, left eye, mild stage: Secondary | ICD-10-CM | POA: Diagnosis not present

## 2022-07-11 DIAGNOSIS — H35033 Hypertensive retinopathy, bilateral: Secondary | ICD-10-CM | POA: Diagnosis not present

## 2022-07-11 DIAGNOSIS — H401112 Primary open-angle glaucoma, right eye, moderate stage: Secondary | ICD-10-CM | POA: Diagnosis not present

## 2022-08-22 DIAGNOSIS — Z01419 Encounter for gynecological examination (general) (routine) without abnormal findings: Secondary | ICD-10-CM | POA: Diagnosis not present

## 2022-08-25 DIAGNOSIS — E039 Hypothyroidism, unspecified: Secondary | ICD-10-CM | POA: Diagnosis not present

## 2022-08-25 DIAGNOSIS — G4733 Obstructive sleep apnea (adult) (pediatric): Secondary | ICD-10-CM | POA: Diagnosis not present

## 2022-08-25 DIAGNOSIS — R7303 Prediabetes: Secondary | ICD-10-CM | POA: Diagnosis not present

## 2022-08-25 DIAGNOSIS — I10 Essential (primary) hypertension: Secondary | ICD-10-CM | POA: Diagnosis not present

## 2022-09-02 DIAGNOSIS — E668 Other obesity: Secondary | ICD-10-CM | POA: Diagnosis not present

## 2022-09-02 DIAGNOSIS — I10 Essential (primary) hypertension: Secondary | ICD-10-CM | POA: Diagnosis not present

## 2022-09-02 DIAGNOSIS — G4733 Obstructive sleep apnea (adult) (pediatric): Secondary | ICD-10-CM | POA: Diagnosis not present

## 2022-09-12 DIAGNOSIS — H401112 Primary open-angle glaucoma, right eye, moderate stage: Secondary | ICD-10-CM | POA: Diagnosis not present

## 2022-09-12 DIAGNOSIS — H01131 Eczematous dermatitis of right upper eyelid: Secondary | ICD-10-CM | POA: Diagnosis not present

## 2022-09-12 DIAGNOSIS — H04123 Dry eye syndrome of bilateral lacrimal glands: Secondary | ICD-10-CM | POA: Diagnosis not present

## 2022-10-10 DIAGNOSIS — H401112 Primary open-angle glaucoma, right eye, moderate stage: Secondary | ICD-10-CM | POA: Diagnosis not present

## 2022-10-10 DIAGNOSIS — H10013 Acute follicular conjunctivitis, bilateral: Secondary | ICD-10-CM | POA: Diagnosis not present

## 2022-10-10 DIAGNOSIS — H01131 Eczematous dermatitis of right upper eyelid: Secondary | ICD-10-CM | POA: Diagnosis not present

## 2022-10-22 DIAGNOSIS — K6389 Other specified diseases of intestine: Secondary | ICD-10-CM | POA: Diagnosis not present

## 2022-10-22 DIAGNOSIS — Z1211 Encounter for screening for malignant neoplasm of colon: Secondary | ICD-10-CM | POA: Diagnosis not present

## 2022-10-22 DIAGNOSIS — K5730 Diverticulosis of large intestine without perforation or abscess without bleeding: Secondary | ICD-10-CM | POA: Diagnosis not present

## 2022-10-23 DIAGNOSIS — H10013 Acute follicular conjunctivitis, bilateral: Secondary | ICD-10-CM | POA: Diagnosis not present

## 2022-10-23 DIAGNOSIS — H401112 Primary open-angle glaucoma, right eye, moderate stage: Secondary | ICD-10-CM | POA: Diagnosis not present

## 2022-10-23 DIAGNOSIS — H01131 Eczematous dermatitis of right upper eyelid: Secondary | ICD-10-CM | POA: Diagnosis not present

## 2023-01-20 DIAGNOSIS — H401112 Primary open-angle glaucoma, right eye, moderate stage: Secondary | ICD-10-CM | POA: Diagnosis not present

## 2023-01-20 DIAGNOSIS — H401121 Primary open-angle glaucoma, left eye, mild stage: Secondary | ICD-10-CM | POA: Diagnosis not present

## 2023-02-16 DIAGNOSIS — I10 Essential (primary) hypertension: Secondary | ICD-10-CM | POA: Diagnosis not present

## 2023-02-16 DIAGNOSIS — R7303 Prediabetes: Secondary | ICD-10-CM | POA: Diagnosis not present

## 2023-02-16 DIAGNOSIS — E039 Hypothyroidism, unspecified: Secondary | ICD-10-CM | POA: Diagnosis not present

## 2023-03-06 DIAGNOSIS — H401112 Primary open-angle glaucoma, right eye, moderate stage: Secondary | ICD-10-CM | POA: Diagnosis not present

## 2023-03-06 DIAGNOSIS — H401121 Primary open-angle glaucoma, left eye, mild stage: Secondary | ICD-10-CM | POA: Diagnosis not present

## 2023-03-18 DIAGNOSIS — J205 Acute bronchitis due to respiratory syncytial virus: Secondary | ICD-10-CM | POA: Diagnosis not present

## 2023-03-18 DIAGNOSIS — R051 Acute cough: Secondary | ICD-10-CM | POA: Diagnosis not present

## 2023-03-18 DIAGNOSIS — Z03818 Encounter for observation for suspected exposure to other biological agents ruled out: Secondary | ICD-10-CM | POA: Diagnosis not present

## 2023-03-26 DIAGNOSIS — Z1231 Encounter for screening mammogram for malignant neoplasm of breast: Secondary | ICD-10-CM | POA: Diagnosis not present

## 2023-08-20 IMAGING — CT CT CARDIAC CORONARY ARTERY CALCIUM SCORE
3 series · 14 of 20 positions shown, 16 images · non-contrast
Comparison: None.

CLINICAL DATA: Elevated cholesterol

EXAM:
CT CARDIAC CORONARY ARTERY CALCIUM SCORE
TECHNIQUE: Non-contrast imaging through the heart was performed using
prospective ECG gating. Image post processing was performed on an
independent workstation, allowing for quantitative analysis of the
heart and coronary arteries. Note that this exam targets the heart
and the chest was not imaged in its entirety.

[Series 2: calcium scoring 2.00 qr36 bestdiast 63% hrt calciu · axial · 0.35mm/px · z∈[+1720,+1796]mm · 4 of 64 slices shown]
[im 13/64  vessel]
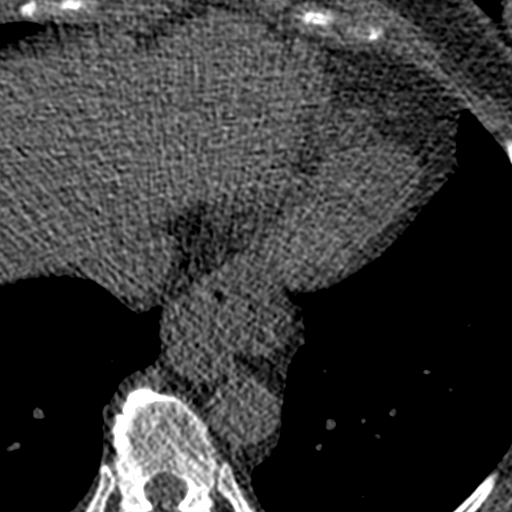
[im 26/64  vessel]
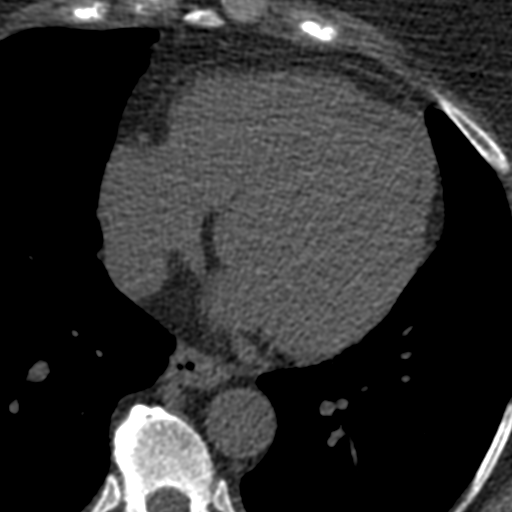
[im 38/64  vessel]
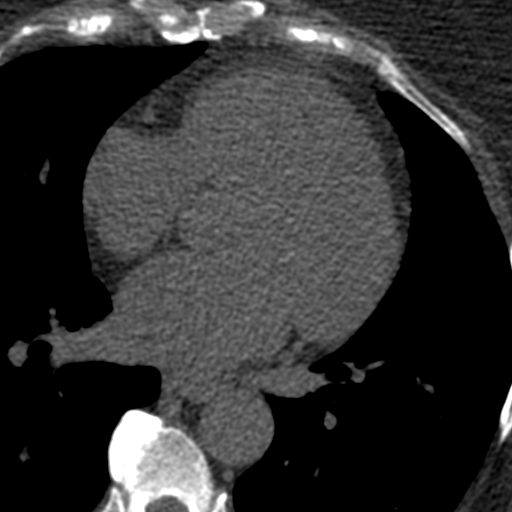
[im 51/64  vessel]
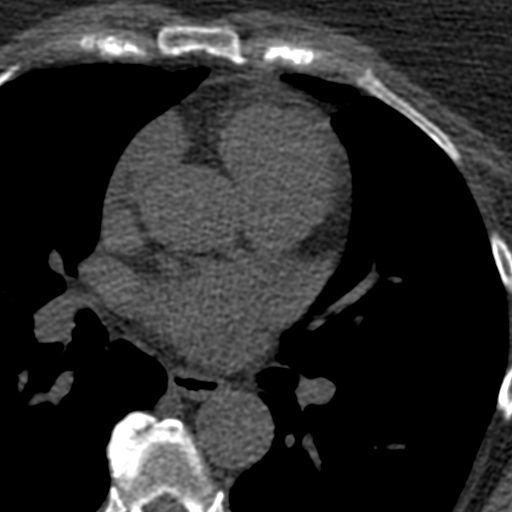

[Series 3: calcium scoring 2.00 br40 bestdiast 63% axial · axial · 0.65mm/px · z∈[+1717,+1809]mm · 5 of 70 slices shown, 7 images]
[im 12/70  vessel]
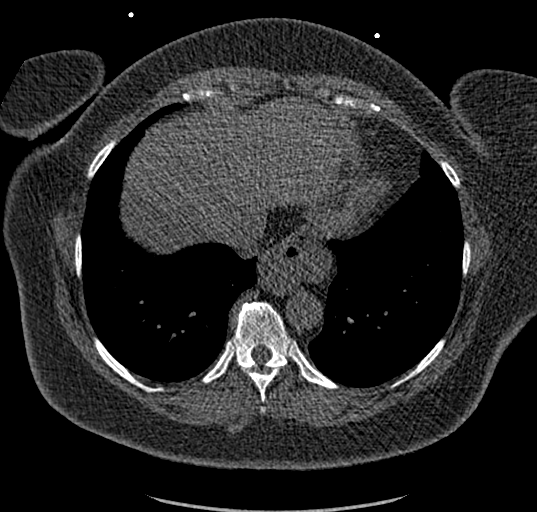
[im 12/70  lung]
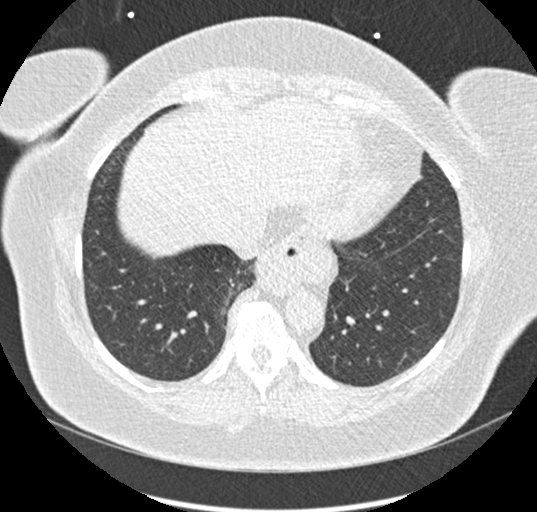
[im 24/70  vessel]
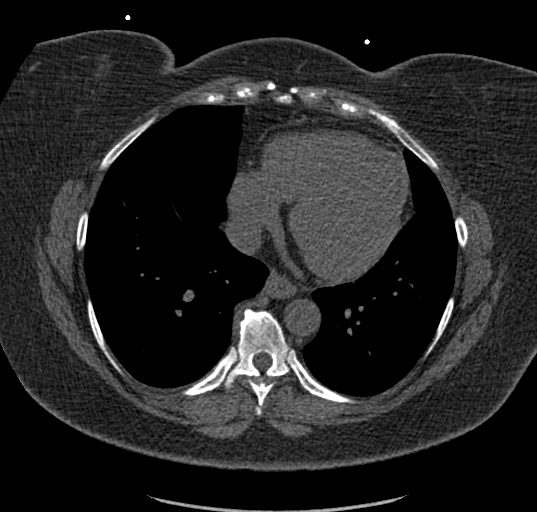
[im 35/70  vessel]
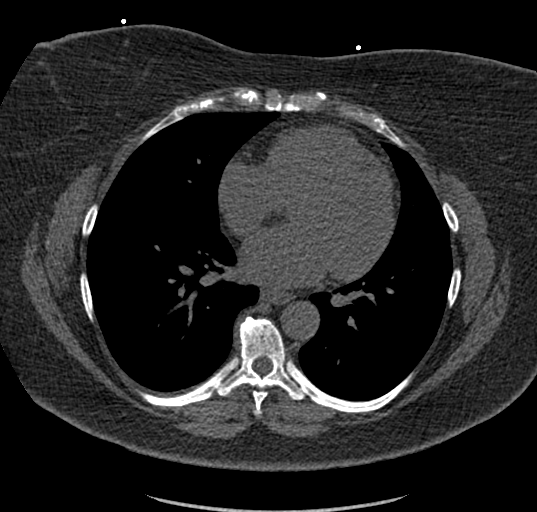
[im 47/70  vessel]
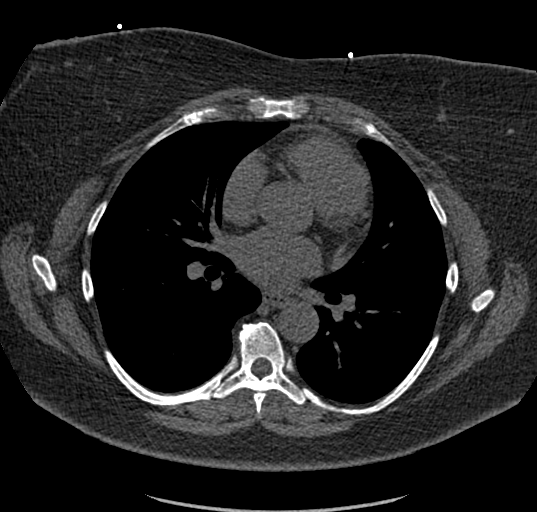
[im 58/70  vessel]
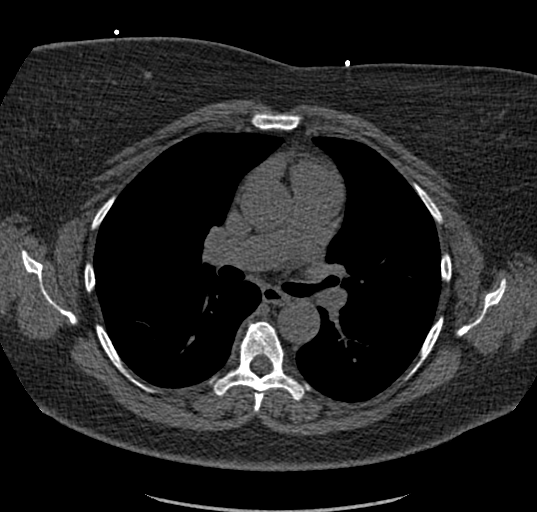
[im 58/70  lung]
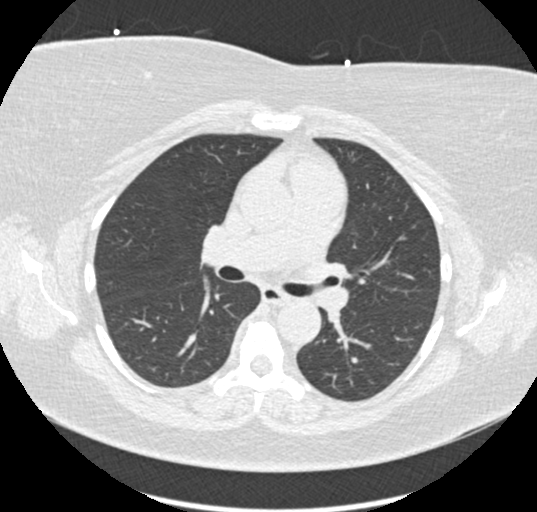

[Series 9: calcium scoring 2.00 br60 bestdiast 63% lungs · axial · 0.65mm/px · z∈[+1717,+1809]mm · 5 of 70 slices shown]
[im 12/70  vessel]
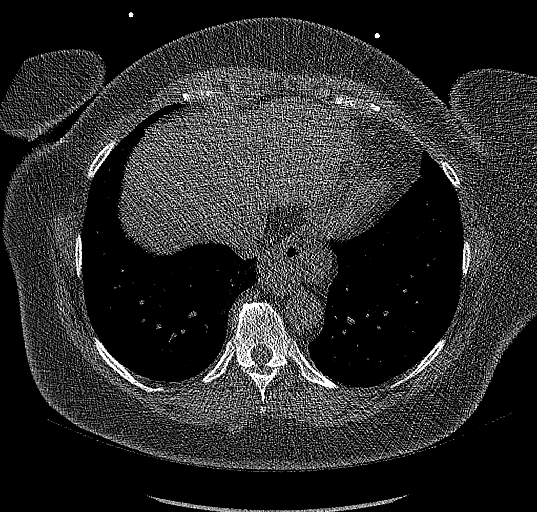
[im 24/70  vessel]
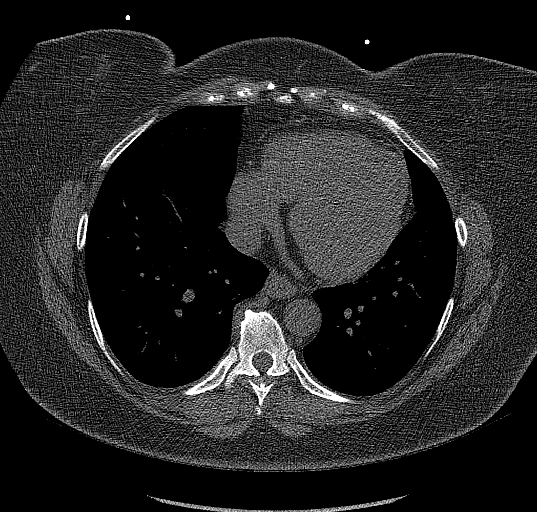
[im 35/70  vessel]
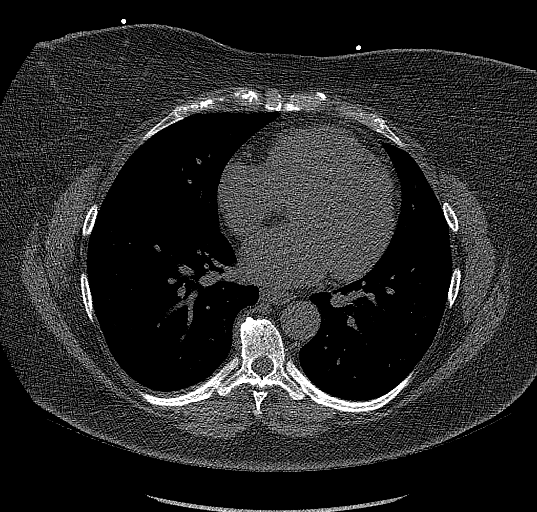
[im 47/70  vessel]
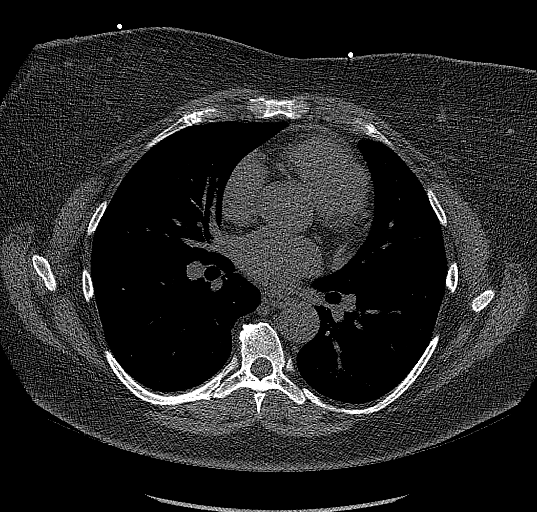
[im 58/70  vessel]
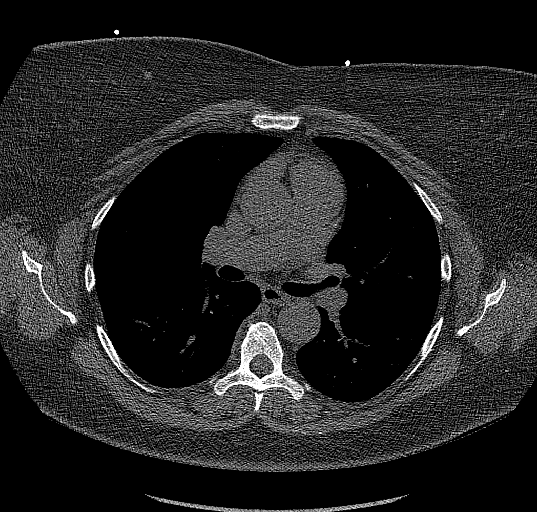

[14 of 20 positions shown; findings below may reference images not displayed]

FINDINGS: CORONARY CALCIUM SCORES:

Left Main: 0

LAD: 0

LCx: 0

RCA: 0

Total Agatston Score: 0

[HOSPITAL] percentile: 0

AORTA MEASUREMENTS:

Ascending Aorta: 31 mm

Descending Aorta: 26 mm

OTHER FINDINGS:

Heart is normal size. Aorta normal caliber. No adenopathy.
Moderate-sized hiatal hernia. No confluent opacities or effusions.
Insert for abdomen Chest wall soft tissues are unremarkable. No
acute bony abnormality.
IMPRESSION: No visible coronary artery calcifications. Total coronary calcium
score of 0.

Moderate-sized hiatal hernia.

No acute extra cardiac abnormality.

## 2023-08-21 DIAGNOSIS — H25813 Combined forms of age-related cataract, bilateral: Secondary | ICD-10-CM | POA: Diagnosis not present

## 2023-08-21 DIAGNOSIS — H401121 Primary open-angle glaucoma, left eye, mild stage: Secondary | ICD-10-CM | POA: Diagnosis not present

## 2023-08-21 DIAGNOSIS — H401112 Primary open-angle glaucoma, right eye, moderate stage: Secondary | ICD-10-CM | POA: Diagnosis not present

## 2023-08-27 DIAGNOSIS — E039 Hypothyroidism, unspecified: Secondary | ICD-10-CM | POA: Diagnosis not present

## 2023-08-27 DIAGNOSIS — R7303 Prediabetes: Secondary | ICD-10-CM | POA: Diagnosis not present

## 2023-08-27 DIAGNOSIS — I10 Essential (primary) hypertension: Secondary | ICD-10-CM | POA: Diagnosis not present

## 2023-11-25 DIAGNOSIS — Z01419 Encounter for gynecological examination (general) (routine) without abnormal findings: Secondary | ICD-10-CM | POA: Diagnosis not present
